# Patient Record
Sex: Female | Born: 1980 | Race: Black or African American | Hispanic: No | Marital: Single | Smoking: Never smoker
Health system: Southern US, Community
[De-identification: ages and names within clinical notes are randomized; demographics above are authoritative.]

## PROBLEM LIST (undated history)

## (undated) ENCOUNTER — Inpatient Hospital Stay (HOSPITAL_COMMUNITY): Payer: Self-pay

## (undated) DIAGNOSIS — E669 Obesity, unspecified: Secondary | ICD-10-CM

## (undated) DIAGNOSIS — Z98891 History of uterine scar from previous surgery: Secondary | ICD-10-CM

## (undated) DIAGNOSIS — K8020 Calculus of gallbladder without cholecystitis without obstruction: Secondary | ICD-10-CM

## (undated) DIAGNOSIS — Z3480 Encounter for supervision of other normal pregnancy, unspecified trimester: Secondary | ICD-10-CM

## (undated) DIAGNOSIS — K219 Gastro-esophageal reflux disease without esophagitis: Secondary | ICD-10-CM

---

## 2008-04-05 ENCOUNTER — Emergency Department (HOSPITAL_COMMUNITY): Admission: EM | Admit: 2008-04-05 | Discharge: 2008-04-05 | Payer: Self-pay | Admitting: Emergency Medicine

## 2008-08-18 ENCOUNTER — Emergency Department (HOSPITAL_COMMUNITY): Admission: EM | Admit: 2008-08-18 | Discharge: 2008-08-18 | Payer: Self-pay | Admitting: Emergency Medicine

## 2008-08-18 ENCOUNTER — Emergency Department (HOSPITAL_COMMUNITY): Admission: EM | Admit: 2008-08-18 | Discharge: 2008-08-19 | Payer: Self-pay | Admitting: Emergency Medicine

## 2008-08-20 ENCOUNTER — Emergency Department (HOSPITAL_COMMUNITY): Admission: EM | Admit: 2008-08-20 | Discharge: 2008-08-20 | Payer: Self-pay | Admitting: Emergency Medicine

## 2008-08-29 ENCOUNTER — Emergency Department (HOSPITAL_COMMUNITY): Admission: EM | Admit: 2008-08-29 | Discharge: 2008-08-29 | Payer: Self-pay | Admitting: Emergency Medicine

## 2010-04-04 ENCOUNTER — Inpatient Hospital Stay (HOSPITAL_COMMUNITY): Admission: AD | Admit: 2010-04-04 | Discharge: 2010-04-04 | Payer: Self-pay | Admitting: Obstetrics and Gynecology

## 2010-07-20 ENCOUNTER — Inpatient Hospital Stay (HOSPITAL_COMMUNITY): Admission: AD | Admit: 2010-07-20 | Discharge: 2010-07-20 | Payer: Self-pay | Admitting: Obstetrics and Gynecology

## 2011-01-27 LAB — WET PREP, GENITAL

## 2011-01-27 LAB — URINALYSIS, ROUTINE W REFLEX MICROSCOPIC
Glucose, UA: NEGATIVE mg/dL
Ketones, ur: >80 mg/dL — AB
Nitrite: NEGATIVE
Specific Gravity, Urine: >1.03 — ABNORMAL HIGH (ref 1.005–1.030)
pH: 6 (ref 5.0–8.0)

## 2011-01-27 LAB — URINE MICROSCOPIC-ADD ON

## 2011-01-27 LAB — STREP B DNA PROBE

## 2011-05-26 ENCOUNTER — Other Ambulatory Visit (HOSPITAL_COMMUNITY): Payer: Self-pay

## 2011-05-31 ENCOUNTER — Inpatient Hospital Stay (HOSPITAL_COMMUNITY): Admission: RE | Admit: 2011-05-31 | Payer: Self-pay | Source: Ambulatory Visit

## 2011-06-02 ENCOUNTER — Ambulatory Visit (HOSPITAL_COMMUNITY): Admission: RE | Admit: 2011-06-02 | Payer: Self-pay | Source: Ambulatory Visit | Admitting: Obstetrics and Gynecology

## 2011-06-02 ENCOUNTER — Encounter (HOSPITAL_COMMUNITY): Admission: RE | Payer: Self-pay | Source: Ambulatory Visit

## 2011-06-02 SURGERY — LIGATION, FALLOPIAN TUBE, LAPAROSCOPIC
Anesthesia: General | Laterality: Bilateral

## 2011-08-16 LAB — CBC
HCT: 37.4
HCT: 38.8
Hemoglobin: 12.5
MCHC: 33.2
MCHC: 33.3
MCV: 88.7
MCV: 89.4
Platelets: 214
Platelets: 231
RDW: 13.3
WBC: 6.9

## 2011-08-16 LAB — DIFFERENTIAL
Basophils Absolute: 0
Basophils Absolute: 0.1
Basophils Relative: 0
Eosinophils Absolute: 0
Eosinophils Absolute: 0.1
Eosinophils Relative: 1
Eosinophils Relative: 1
Lymphocytes Relative: 13
Lymphocytes Relative: 16
Lymphs Abs: 0.9
Lymphs Abs: 1.7
Monocytes Relative: 5
Monocytes Relative: 6
Neutro Abs: 8.1 — ABNORMAL HIGH
Neutrophils Relative %: 76

## 2011-08-16 LAB — URINALYSIS, ROUTINE W REFLEX MICROSCOPIC
Bilirubin Urine: NEGATIVE
Hgb urine dipstick: NEGATIVE
Ketones, ur: NEGATIVE
Nitrite: NEGATIVE
Protein, ur: NEGATIVE
Specific Gravity, Urine: 1.026
Urobilinogen, UA: 0.2
pH: 5.5

## 2011-08-16 LAB — COMPREHENSIVE METABOLIC PANEL
ALT: 21
AST: 22
Albumin: 3.5
Alkaline Phosphatase: 68
Alkaline Phosphatase: 69
CO2: 27
CO2: 28
Calcium: 8.6
Chloride: 102
GFR calc Af Amer: >60
Glucose, Bld: 99
Sodium: 134 — ABNORMAL LOW
Total Bilirubin: 0.5
Total Bilirubin: 0.5

## 2011-08-16 LAB — URINE CULTURE: Colony Count: NO GROWTH

## 2011-08-16 LAB — PREGNANCY, URINE: Preg Test, Ur: NEGATIVE

## 2011-08-16 LAB — URINE MICROSCOPIC-ADD ON

## 2011-08-16 LAB — LIPASE, BLOOD: Lipase: 18

## 2012-01-19 LAB — OB RESULTS CONSOLE RUBELLA ANTIBODY, IGM: Rubella: IMMUNE

## 2012-01-19 LAB — OB RESULTS CONSOLE ABO/RH: RH Type: POSITIVE

## 2012-01-19 LAB — OB RESULTS CONSOLE HEPATITIS B SURFACE ANTIGEN: Hepatitis B Surface Ag: NEGATIVE

## 2012-01-19 LAB — OB RESULTS CONSOLE ANTIBODY SCREEN: Antibody Screen: NEGATIVE

## 2012-01-19 LAB — OB RESULTS CONSOLE GC/CHLAMYDIA: Chlamydia: NEGATIVE

## 2012-02-01 ENCOUNTER — Other Ambulatory Visit (HOSPITAL_COMMUNITY): Payer: Self-pay | Admitting: Obstetrics and Gynecology

## 2012-02-01 ENCOUNTER — Other Ambulatory Visit: Payer: Self-pay

## 2012-02-01 DIAGNOSIS — Z0489 Encounter for examination and observation for other specified reasons: Secondary | ICD-10-CM

## 2012-02-22 ENCOUNTER — Encounter (HOSPITAL_COMMUNITY): Payer: Self-pay

## 2012-02-22 ENCOUNTER — Ambulatory Visit (HOSPITAL_COMMUNITY)
Admission: RE | Admit: 2012-02-22 | Discharge: 2012-02-22 | Disposition: A | Discharge status: Home / Self Care | Payer: Medicaid Other | Source: Ambulatory Visit | Attending: Obstetrics and Gynecology | Admitting: Obstetrics and Gynecology

## 2012-02-22 VITALS — BP 111/66 | HR 74 | Wt 259.0 lb

## 2012-02-22 DIAGNOSIS — E669 Obesity, unspecified: Secondary | ICD-10-CM | POA: Insufficient documentation

## 2012-02-22 DIAGNOSIS — O99210 Obesity complicating pregnancy, unspecified trimester: Secondary | ICD-10-CM | POA: Insufficient documentation

## 2012-02-22 DIAGNOSIS — R638 Other symptoms and signs concerning food and fluid intake: Secondary | ICD-10-CM

## 2012-02-22 DIAGNOSIS — O358XX0 Maternal care for other (suspected) fetal abnormality and damage, not applicable or unspecified: Secondary | ICD-10-CM | POA: Insufficient documentation

## 2012-02-22 DIAGNOSIS — Z0489 Encounter for examination and observation for other specified reasons: Secondary | ICD-10-CM

## 2012-02-22 DIAGNOSIS — Z1389 Encounter for screening for other disorder: Secondary | ICD-10-CM | POA: Insufficient documentation

## 2012-02-22 DIAGNOSIS — Z363 Encounter for antenatal screening for malformations: Secondary | ICD-10-CM | POA: Insufficient documentation

## 2012-02-22 DIAGNOSIS — O34219 Maternal care for unspecified type scar from previous cesarean delivery: Secondary | ICD-10-CM | POA: Insufficient documentation

## 2012-02-22 HISTORY — DX: Obesity, unspecified: E66.9

## 2012-02-22 NOTE — Progress Notes (Signed)
Obstetric ultrasound performed today.   Appropriate fetal growth based on LMP Normal amniotic fluid volume Normal fetal anatomic survey (some limited heart, spine, and abdominal wall views) No markers of fetal aneuploidy identified  Repeat ultrasound scheduled in 4-6 weeks to re evaluate fetal growth and anatomy.   Please see full report in [facility]

## 2012-03-11 ENCOUNTER — Inpatient Hospital Stay (HOSPITAL_COMMUNITY)
Admission: AD | Admit: 2012-03-11 | Discharge: 2012-03-11 | Disposition: A | Discharge status: Home / Self Care | Payer: Medicaid Other | Source: Ambulatory Visit | Attending: Obstetrics and Gynecology | Admitting: Obstetrics and Gynecology

## 2012-03-11 ENCOUNTER — Encounter (HOSPITAL_COMMUNITY): Payer: Self-pay | Admitting: *Deleted

## 2012-03-11 DIAGNOSIS — O368190 Decreased fetal movements, unspecified trimester, not applicable or unspecified: Secondary | ICD-10-CM | POA: Insufficient documentation

## 2012-03-11 NOTE — MAU Note (Signed)
Pt admitted via [facility]. EMS to Rm #5. Has not felt baby move since 2400. Alert and oriented. Ambulatoy in room when off stretcher. Oriented to room.

## 2012-03-11 NOTE — MAU Note (Signed)
"  The baby is usually very active. I have not felt him move since 2400"

## 2012-03-11 NOTE — Discharge Instructions (Signed)
Fetal Movement Counts Patient Name: __________________________________________________ Patient Due Date: ____________________ Kick counts is highly recommended in high risk pregnancies, but it is a good idea for every pregnant woman to do. Start counting fetal movements at 28 weeks of the pregnancy. Fetal movements increase after eating a full meal or eating or drinking something sweet (the blood sugar is higher). It is also important to drink plenty of fluids (well hydrated) before doing the count. Lie on your left side because it helps with the circulation or you can sit in a comfortable chair with your arms over your belly (abdomen) with no distractions around you. DOING THE COUNT  Try to do the count the same time of day each time you do it.   Mark the day and time, then see how long it takes for you to feel 10 movements (kicks, flutters, swishes, rolls). You should have at least 10 movements within 2 hours. You will most likely feel 10 movements in much less than 2 hours. If you do not, wait an hour and count again. After a couple of days you will see a pattern.   What you are looking for is a change in the pattern or not enough counts in 2 hours. Is it taking longer in time to reach 10 movements?  SEEK MEDICAL CARE IF:  You feel less than 10 counts in 2 hours. Tried twice.   No movement in one hour.   The pattern is changing or taking longer each day to reach 10 counts in 2 hours.   You feel the baby is not moving as it usually does.  Date: ____________ Movements: ____________ Start time: ____________ Finish time: ____________  Date: ____________ Movements: ____________ Start time: ____________ Finish time: ____________ Date: ____________ Movements: ____________ Start time: ____________ Finish time: ____________ Date: ____________ Movements: ____________ Start time: ____________ Finish time: ____________ Date: ____________ Movements: ____________ Start time: ____________ Finish time:  ____________ Date: ____________ Movements: ____________ Start time: ____________ Finish time: ____________ Date: ____________ Movements: ____________ Start time: ____________ Finish time: ____________ Date: ____________ Movements: ____________ Start time: ____________ Finish time: ____________  Date: ____________ Movements: ____________ Start time: ____________ Finish time: ____________ Date: ____________ Movements: ____________ Start time: ____________ Finish time: ____________ Date: ____________ Movements: ____________ Start time: ____________ Finish time: ____________ Date: ____________ Movements: ____________ Start time: ____________ Finish time: ____________ Date: ____________ Movements: ____________ Start time: ____________ Finish time: ____________ Date: ____________ Movements: ____________ Start time: ____________ Finish time: ____________ Date: ____________ Movements: ____________ Start time: ____________ Finish time: ____________  Date: ____________ Movements: ____________ Start time: ____________ Finish time: ____________ Date: ____________ Movements: ____________ Start time: ____________ Finish time: ____________ Date: ____________ Movements: ____________ Start time: ____________ Finish time: ____________ Date: ____________ Movements: ____________ Start time: ____________ Finish time: ____________ Date: ____________ Movements: ____________ Start time: ____________ Finish time: ____________ Date: ____________ Movements: ____________ Start time: ____________ Finish time: ____________ Date: ____________ Movements: ____________ Start time: ____________ Finish time: ____________  Date: ____________ Movements: ____________ Start time: ____________ Finish time: ____________ Date: ____________ Movements: ____________ Start time: ____________ Finish time: ____________ Date: ____________ Movements: ____________ Start time: ____________ Finish time: ____________ Date: ____________ Movements:  ____________ Start time: ____________ Finish time: ____________ Date: ____________ Movements: ____________ Start time: ____________ Finish time: ____________ Date: ____________ Movements: ____________ Start time: ____________ Finish time: ____________ Date: ____________ Movements: ____________ Start time: ____________ Finish time: ____________  Date: ____________ Movements: ____________ Start time: ____________ Finish time: ____________ Date: ____________ Movements: ____________ Start time: ____________ Finish time: ____________ Date: ____________ Movements: ____________ Start time:   ____________ Finish time: ____________ Date: ____________ Movements: ____________ Start time: ____________ Finish time: ____________ Date: ____________ Movements: ____________ Start time: ____________ Finish time: ____________ Date: ____________ Movements: ____________ Start time: ____________ Finish time: ____________ Date: ____________ Movements: ____________ Start time: ____________ Finish time: ____________  Date: ____________ Movements: ____________ Start time: ____________ Finish time: ____________ Date: ____________ Movements: ____________ Start time: ____________ Finish time: ____________ Date: ____________ Movements: ____________ Start time: ____________ Finish time: ____________ Date: ____________ Movements: ____________ Start time: ____________ Finish time: ____________ Date: ____________ Movements: ____________ Start time: ____________ Finish time: ____________ Date: ____________ Movements: ____________ Start time: ____________ Finish time: ____________ Date: ____________ Movements: ____________ Start time: ____________ Finish time: ____________  Date: ____________ Movements: ____________ Start time: ____________ Finish time: ____________ Date: ____________ Movements: ____________ Start time: ____________ Finish time: ____________ Date: ____________ Movements: ____________ Start time: ____________ Finish  time: ____________ Date: ____________ Movements: ____________ Start time: ____________ Finish time: ____________ Date: ____________ Movements: ____________ Start time: ____________ Finish time: ____________ Date: ____________ Movements: ____________ Start time: ____________ Finish time: ____________ Date: ____________ Movements: ____________ Start time: ____________ Finish time: ____________  Date: ____________ Movements: ____________ Start time: ____________ Finish time: ____________ Date: ____________ Movements: ____________ Start time: ____________ Finish time: ____________ Date: ____________ Movements: ____________ Start time: ____________ Finish time: ____________ Date: ____________ Movements: ____________ Start time: ____________ Finish time: ____________ Date: ____________ Movements: ____________ Start time: ____________ Finish time: ____________ Date: ____________ Movements: ____________ Start time: ____________ Finish time: ____________ Document Released: 11/30/2006 Document Revised: 10/20/2011 Document Reviewed: 06/02/2009 [employer] Patient Information 2012 [employer]. 

## 2012-03-11 NOTE — Progress Notes (Signed)
Ivonne Andrew CNM in. EFM strip reviewed. D/C plan discussed.

## 2012-03-11 NOTE — Progress Notes (Signed)
Ivonne Andrew CNM notified of pt's admission and status. Will see pt. Baby active and pt feeling FM

## 2012-03-11 NOTE — MAU Provider Note (Signed)
Chief Complaint:  Decreased Fetal Movement   First Provider Initiated Contact with Patient 03/11/12 0519      HPI  [name] is  31 y.o. G4P3003 at [redacted]w[redacted]d presents with decreased Fm. Denies contractions, leakage of fluid or vaginal bleeding.    Past Medical History: Past Medical History  Diagnosis Date  . Obesity   . Obesity     Past Surgical History: Past Surgical History  Procedure Date  . Cesarean section     Family History: Family History  Problem Relation Age of Onset  . Anesthesia problems Neg Hx   . Hypotension Neg Hx   . Malignant hyperthermia Neg Hx   . Pseudochol deficiency Neg Hx     Social History: History  Substance Use Topics  . Smoking status: Never Smoker   . Smokeless tobacco: Not on file  . Alcohol Use: No    Allergies:  Allergies  Allergen Reactions  . Amoxicillin Hives  . Penicillins Hives    Meds:  Prescriptions prior to admission  Medication Sig Dispense Refill  . Prenatal Vit-Fe Fumarate-FA (PRENATAL MULTIVITAMIN) TABS Take 1 tablet by mouth daily.          Physical Exam  Blood pressure 120/60, pulse 105, temperature 97.1 F (36.2 C), temperature source Oral, resp. rate 20, height 5\' 3"  (1.6 m), weight 110.224 kg (243 lb), last menstrual period 08/29/2011. GENERAL: Well-developed, well-nourished female in no acute distress.  HEENT: normocephalic, good dentition HEART: normal rate RESP: normal effort ABDOMEN: Soft, nontender, nondistended, gravid.  EXTREMITIES: Nontender, no edema NEURO: alert and oriented  SPECULUM EXAM: Deferred    FHT:  Baseline 140 , moderate variability, accelerations present, no decelerations Contractions: none   Pt feeling baby move while in MAU  Assessment: 1. Decreased fetal movement in pregnancy, antepartum, resolved    Plan: D/C home per consult w/ [name] Follow-up Information    Follow up with Angela Plume, MD. (as scheduled or  as needed if symptoms worsen)    Contact  information:   [employer] [address] [place], [address] [place] [address] [phone]         Medication List  As of 03/11/2012  5:37 AM   CONTINUE taking these medications         prenatal multivitamin Tabs          PTL precautions and FKCs  [name] 4/28/20135:32 AM

## 2012-03-11 NOTE — Progress Notes (Signed)
Written and verbal d/c instructions given and understanding voiced. Fetal kick count reviewed.

## 2012-03-28 ENCOUNTER — Ambulatory Visit (HOSPITAL_COMMUNITY)
Admission: RE | Admit: 2012-03-28 | Discharge: 2012-03-28 | Disposition: A | Discharge status: Home / Self Care | Payer: Medicaid Other | Source: Ambulatory Visit | Attending: Obstetrics and Gynecology | Admitting: Obstetrics and Gynecology

## 2012-03-28 VITALS — BP 97/52 | HR 83 | Wt 255.0 lb

## 2012-03-28 DIAGNOSIS — O99210 Obesity complicating pregnancy, unspecified trimester: Secondary | ICD-10-CM | POA: Insufficient documentation

## 2012-03-28 DIAGNOSIS — Z1389 Encounter for screening for other disorder: Secondary | ICD-10-CM | POA: Insufficient documentation

## 2012-03-28 DIAGNOSIS — R638 Other symptoms and signs concerning food and fluid intake: Secondary | ICD-10-CM

## 2012-03-28 DIAGNOSIS — O358XX0 Maternal care for other (suspected) fetal abnormality and damage, not applicable or unspecified: Secondary | ICD-10-CM | POA: Insufficient documentation

## 2012-03-28 DIAGNOSIS — O34219 Maternal care for unspecified type scar from previous cesarean delivery: Secondary | ICD-10-CM | POA: Insufficient documentation

## 2012-03-28 DIAGNOSIS — Z363 Encounter for antenatal screening for malformations: Secondary | ICD-10-CM | POA: Insufficient documentation

## 2012-03-28 DIAGNOSIS — E669 Obesity, unspecified: Secondary | ICD-10-CM | POA: Insufficient documentation

## 2012-05-06 ENCOUNTER — Inpatient Hospital Stay (HOSPITAL_COMMUNITY)
Admission: AD | Admit: 2012-05-06 | Discharge: 2012-05-08 | DRG: 781 | Disposition: A | Discharge status: Home / Self Care | Payer: Medicaid Other | Source: Ambulatory Visit | Attending: Obstetrics and Gynecology | Admitting: Obstetrics and Gynecology

## 2012-05-06 ENCOUNTER — Encounter (HOSPITAL_COMMUNITY): Payer: Self-pay

## 2012-05-06 ENCOUNTER — Inpatient Hospital Stay (HOSPITAL_COMMUNITY): Payer: Medicaid Other

## 2012-05-06 DIAGNOSIS — K819 Cholecystitis, unspecified: Secondary | ICD-10-CM

## 2012-05-06 DIAGNOSIS — R1011 Right upper quadrant pain: Secondary | ICD-10-CM | POA: Diagnosis present

## 2012-05-06 DIAGNOSIS — K8010 Calculus of gallbladder with chronic cholecystitis without obstruction: Secondary | ICD-10-CM | POA: Diagnosis present

## 2012-05-06 DIAGNOSIS — O9989 Other specified diseases and conditions complicating pregnancy, childbirth and the puerperium: Principal | ICD-10-CM | POA: Diagnosis present

## 2012-05-06 DIAGNOSIS — O26899 Other specified pregnancy related conditions, unspecified trimester: Secondary | ICD-10-CM

## 2012-05-06 DIAGNOSIS — M549 Dorsalgia, unspecified: Secondary | ICD-10-CM | POA: Diagnosis present

## 2012-05-06 DIAGNOSIS — K8020 Calculus of gallbladder without cholecystitis without obstruction: Secondary | ICD-10-CM

## 2012-05-06 HISTORY — DX: Gastro-esophageal reflux disease without esophagitis: K21.9

## 2012-05-06 LAB — COMPREHENSIVE METABOLIC PANEL
ALT: 5 U/L (ref 0–35)
Alkaline Phosphatase: 93 U/L (ref 39–117)
BUN: 7 mg/dL (ref 6–23)
CO2: 19 mEq/L (ref 19–32)
Chloride: 107 mEq/L (ref 96–112)
GFR calc Af Amer: >90 mL/min (ref >90)
Glucose, Bld: 86 mg/dL (ref 70–99)
Potassium: 3.7 mEq/L (ref 3.5–5.1)
Sodium: 138 mEq/L (ref 135–145)
Total Bilirubin: 0.3 mg/dL (ref 0.3–1.2)
Total Protein: 6.1 g/dL (ref 6.0–8.3)

## 2012-05-06 LAB — CBC
Hemoglobin: 10.1 g/dL — ABNORMAL LOW (ref 12.0–15.0)
Platelets: 139 10*3/uL — ABNORMAL LOW (ref 150–400)
RBC: 3.3 MIL/uL — ABNORMAL LOW (ref 3.87–5.11)
WBC: 8.3 10*3/uL (ref 4.0–10.5)

## 2012-05-06 LAB — DIFFERENTIAL
Eosinophils Absolute: 0 10*3/uL (ref 0.0–0.7)
Lymphocytes Relative: 23 % (ref 12–46)
Lymphs Abs: 1.9 10*3/uL (ref 0.7–4.0)
Monocytes Relative: 7 % (ref 3–12)
Neutro Abs: 5.8 10*3/uL (ref 1.7–7.7)
Neutrophils Relative %: 70 % (ref 43–77)

## 2012-05-06 LAB — URINE MICROSCOPIC-ADD ON

## 2012-05-06 LAB — URINALYSIS, ROUTINE W REFLEX MICROSCOPIC
Bilirubin Urine: NEGATIVE
Ketones, ur: 15 mg/dL — AB
Specific Gravity, Urine: 1.01 (ref 1.005–1.030)
pH: 6 (ref 5.0–8.0)

## 2012-05-06 MED ORDER — PROMETHAZINE HCL 25 MG PO TABS: 12.5000 mg | ORAL_TABLET | Freq: Four times a day (QID) | ORAL | Status: DC | PRN

## 2012-05-06 MED ORDER — PRENATAL MULTIVITAMIN CH
1.0000 | ORAL_TABLET | Freq: Every day | ORAL | Status: DC
  Administered 2012-05-06 – 2012-05-07 (×2): 1 via ORAL
  Filled 2012-05-06 (×2): qty 1

## 2012-05-06 MED ORDER — ALUM & MAG HYDROXIDE-SIMETH 200-200-20 MG/5ML PO SUSP
30.0000 mL | ORAL | Status: DC | PRN
  Administered 2012-05-06 – 2012-05-07 (×2): 30 mL via ORAL
  Filled 2012-05-06 (×2): qty 30

## 2012-05-06 MED ORDER — ZOLPIDEM TARTRATE 5 MG PO TABS: 5.0000 mg | ORAL_TABLET | Freq: Every evening | ORAL | Status: DC | PRN

## 2012-05-06 MED ORDER — SIMETHICONE 80 MG PO CHEW: 80.0000 mg | CHEWABLE_TABLET | Freq: Four times a day (QID) | ORAL | Status: DC | PRN

## 2012-05-06 MED ORDER — ONDANSETRON HCL 4 MG PO TABS: 4.0000 mg | ORAL_TABLET | Freq: Four times a day (QID) | ORAL | Status: DC | PRN

## 2012-05-06 MED ORDER — ONDANSETRON HCL 4 MG/2ML IJ SOLN
4.0000 mg | Freq: Four times a day (QID) | INTRAMUSCULAR | Status: DC | PRN
  Administered 2012-05-06 – 2012-05-07 (×2): 4 mg via INTRAVENOUS
  Filled 2012-05-06 (×3): qty 2

## 2012-05-06 MED ORDER — LACTATED RINGERS IV SOLN
INTRAVENOUS | Status: DC
  Administered 2012-05-06 (×3): via INTRAVENOUS

## 2012-05-06 MED ORDER — ONDANSETRON HCL 4 MG/2ML IJ SOLN
4.0000 mg | Freq: Once | INTRAMUSCULAR | Status: AC
  Administered 2012-05-06: 4 mg via INTRAVENOUS
  Filled 2012-05-06: qty 2

## 2012-05-06 MED ORDER — GI COCKTAIL ~~LOC~~
30.0000 mL | Freq: Once | ORAL | Status: AC
  Administered 2012-05-06: 30 mL via ORAL
  Filled 2012-05-06: qty 30

## 2012-05-06 MED ORDER — HYDROMORPHONE HCL PF 1 MG/ML IJ SOLN
1.0000 mg | Freq: Once | INTRAMUSCULAR | Status: AC
  Administered 2012-05-06: 1 mg via INTRAVENOUS
  Filled 2012-05-06: qty 1

## 2012-05-06 MED ORDER — DOCUSATE SODIUM 100 MG PO CAPS
100.0000 mg | ORAL_CAPSULE | Freq: Two times a day (BID) | ORAL | Status: DC
  Administered 2012-05-06 – 2012-05-07 (×4): 100 mg via ORAL
  Filled 2012-05-06 (×4): qty 1

## 2012-05-06 MED ORDER — HYDROMORPHONE HCL PF 1 MG/ML IJ SOLN
1.0000 mg | INTRAMUSCULAR | Status: DC | PRN
  Administered 2012-05-06 – 2012-05-08 (×9): 1 mg via INTRAVENOUS
  Filled 2012-05-06 (×9): qty 1

## 2012-05-06 MED ORDER — LACTATED RINGERS IV SOLN
INTRAVENOUS | Status: DC
  Administered 2012-05-07 – 2012-05-08 (×4): via INTRAVENOUS

## 2012-05-06 MED ORDER — OXYCODONE-ACETAMINOPHEN 7.5-325 MG PO TABS: 1.0000 | ORAL_TABLET | ORAL | Status: DC | PRN

## 2012-05-06 NOTE — MAU Provider Note (Signed)
History     CSN: [id number]  Arrival date & time 05/06/12  7829   None     Chief Complaint  Patient presents with  . Back Pain    HPI [name] is a 31 y.o. female @ [redacted]w[redacted]d gestation who presents to MAU for abdominal and back pain. The pain started approximately 2 am. She describes the pain as sharp that is constant and woke her. She rates the pain as 8/10.the pain is located in the right upper quadrant of the abdomen. The pain radiates to her back.  Denies nausea, vomiting, leaking of fluid or vaginal bleeding. Scheduled for C-section 05/30/12 with Dr. Ellyn Hack. The history was provided by the patient and her prenatal record.  Past Medical History  Diagnosis Date  . Obesity   . Obesity     Past Surgical History  Procedure Date  . Cesarean section     Family History  Problem Relation Age of Onset  . Anesthesia problems Neg Hx   . Hypotension Neg Hx   . Malignant hyperthermia Neg Hx   . Pseudochol deficiency Neg Hx     History  Substance Use Topics  . Smoking status: Never Smoker   . Smokeless tobacco: Not on file  . Alcohol Use: No    OB History    Grav Para Term Preterm Abortions TAB SAB Ect Mult Living   4 3 3  0 0 0 0 0 0 3      Review of Systems  Constitutional: Negative for fever, chills, diaphoresis and fatigue.  HENT: Negative for ear pain, congestion, sore throat, facial swelling, neck pain, neck stiffness, dental problem and sinus pressure.   Eyes: Negative for photophobia, pain, discharge and visual disturbance.  Respiratory: Negative for cough, chest tightness and wheezing.   Cardiovascular: Positive for leg swelling. Negative for palpitations.  Gastrointestinal: Positive for abdominal pain. Negative for nausea, vomiting, diarrhea, constipation and abdominal distention.  Genitourinary: Negative for dysuria, urgency, frequency, flank pain, vaginal bleeding, vaginal discharge and difficulty urinating.  Musculoskeletal: Positive for back pain.  Negative for myalgias and gait problem.  Skin: Negative for color change and rash.  Neurological: Negative for dizziness, speech difficulty, weakness, light-headedness, numbness and headaches.  Psychiatric/Behavioral: Negative for confusion and agitation. The patient is not nervous/anxious.     Allergies  Amoxicillin and Penicillins  Home Medications  No current outpatient prescriptions on file.  BP 98/49  Pulse 57  Temp 96.6 F (35.9 C) (Axillary)  Resp 18  LMP 08/29/2011  Physical Exam  Nursing note and vitals reviewed. Constitutional: She is oriented to person, place, and time. She appears well-developed and well-nourished. No distress.  HENT:  Head: Normocephalic.  Eyes: EOM are normal.  Neck: Neck supple.  Cardiovascular: Normal rate.   Pulmonary/Chest: Effort normal.  Abdominal: Soft. There is tenderness in the right upper quadrant. There is guarding and positive Murphy's sign. There is no rigidity.  Genitourinary:       Cervix closed, 50%  Musculoskeletal: Normal range of motion.  Neurological: She is alert and oriented to person, place, and time. No cranial nerve deficit.  Skin: Skin is warm and dry.  Psychiatric: She has a normal mood and affect. Her behavior is normal. Judgment and thought content normal.   Results for orders placed during the hospital encounter of 05/06/12 (from the past 24 hour(s))  URINALYSIS, ROUTINE W REFLEX MICROSCOPIC     Status: Abnormal   Collection Time   05/06/12  3:20 AM  Component Value Range   Color, Urine YELLOW  YELLOW   APPearance CLEAR  CLEAR   Specific Gravity, Urine 1.010  1.005 - 1.030   pH 6.0  5.0 - 8.0   Glucose, UA NEGATIVE  NEGATIVE mg/dL   Hgb urine dipstick TRACE (*) NEGATIVE   Bilirubin Urine NEGATIVE  NEGATIVE   Ketones, ur 15 (*) NEGATIVE mg/dL   Protein, ur NEGATIVE  NEGATIVE mg/dL   Urobilinogen, UA 0.2  0.0 - 1.0 mg/dL   Nitrite NEGATIVE  NEGATIVE   Leukocytes, UA LARGE (*) NEGATIVE  URINE  MICROSCOPIC-ADD ON     Status: Abnormal   Collection Time   05/06/12  3:20 AM      Component Value Range   Squamous Epithelial / LPF MANY (*) RARE   WBC, UA TOO NUMEROUS TO COUNT  <3 WBC/hpf   RBC / HPF 0-2  <3 RBC/hpf   Urine-Other MUCOUS PRESENT    CBC     Status: Abnormal   Collection Time   05/06/12  4:42 AM      Component Value Range   WBC 8.3  4.0 - 10.5 K/uL   RBC 3.30 (*) 3.87 - 5.11 MIL/uL   Hemoglobin 10.1 (*) 12.0 - 15.0 g/dL   HCT 16.1 (*) 09.6 - 04.5 %   MCV 89.7  78.0 - 100.0 fL   MCH 30.6  26.0 - 34.0 pg   MCHC 34.1  30.0 - 36.0 g/dL   RDW 40.9  81.1 - 91.4 %   Platelets 139 (*) 150 - 400 K/uL  DIFFERENTIAL     Status: Normal   Collection Time   05/06/12  4:42 AM      Component Value Range   Neutrophils Relative 70  43 - 77 %   Neutro Abs 5.8  1.7 - 7.7 K/uL   Lymphocytes Relative 23  12 - 46 %   Lymphs Abs 1.9  0.7 - 4.0 K/uL   Monocytes Relative 7  3 - 12 %   Monocytes Absolute 0.6  0.1 - 1.0 K/uL   Eosinophils Relative 1  0 - 5 %   Eosinophils Absolute 0.0  0.0 - 0.7 K/uL   Basophils Relative 0  0 - 1 %   Basophils Absolute 0.0  0.0 - 0.1 K/uL  COMPREHENSIVE METABOLIC PANEL     Status: Abnormal   Collection Time   05/06/12  4:42 AM      Component Value Range   Sodium 138  135 - 145 mEq/L   Potassium 3.7  3.5 - 5.1 mEq/L   Chloride 107  96 - 112 mEq/L   CO2 19  19 - 32 mEq/L   Glucose, Bld 86  70 - 99 mg/dL   BUN 7  6 - 23 mg/dL   Creatinine, Ser 7.82  0.50 - 1.10 mg/dL   Calcium 8.9  8.4 - 95.6 mg/dL   Total Protein 6.1  6.0 - 8.3 g/dL   Albumin 2.7 (*) 3.5 - 5.2 g/dL   AST 15  0 - 37 U/L   ALT 5  0 - 35 U/L   Alkaline Phosphatase 93  39 - 117 U/L   Total Bilirubin 0.3  0.3 - 1.2 mg/dL   GFR calc non Af Amer >90  >90 mL/min   GFR calc Af Amer >90  >90 mL/min  LIPASE, BLOOD     Status: Normal   Collection Time   05/06/12  4:42 AM      Component Value  Range   Lipase 24  11 - 59 U/L  AMYLASE     Status: Normal   Collection Time   05/06/12  4:42  AM      Component Value Range   Amylase 22  0 - 105 U/L   ED Course: Discussed with Dr. Ambrose Mantle will draw labs and order ultrasound for possible cholelithiasis   Procedures EFM: base line 135, no decelerations, no contractions, reactive tracing MDM  US Abdomen Limited Ruq  05/06/2012  *RADIOLOGY REPORT*  Clinical Data:  Right upper quadrant abdominal pain, radiating to the upper back.  ABDOMINAL ULTRASOUND LIMITED (RIGHT UPPER QUADRANT)  Comparison:  None  Findings:  Gallbladder:  The gallbladder is mildly distended; a large 2.2 cm stone is noted lodged at the neck of the gallbladder.  An associated positive ultrasonographic Murphy's sign is seen; this may reflect obstruction due to the stone.  No pericholecystic fluid or gallbladder wall thickening is seen to suggest cholecystitis.  Common Bile Duct:  0.4 cm in diameter; within normal limits in caliber.  Liver:  Normal parenchymal echogenicity and echotexture; no focal lesions identified.  Limited Doppler evaluation demonstrates normal blood flow within the liver.  IMPRESSION: Gallbladder distended, with a large 2.2 cm stone lodged at the neck of the gallbladder.  Associated positive ultrasonographic Murphy's sign noted; this may reflect obstruction due to the stone.  No evidence for cholecystitis; no evidence for more distal obstruction.  Original Report Authenticated By: Tonia Ghent, M.D.    Assessment: [redacted]w[redacted]d gestation with abdominal pain     Cholecystitis  Plan:  Dr. Ambrose Mantle notified of lab and ultrasound results, he will see the patient in MAU   Rx Percocet   Dr. Ambrose Mantle will discuss the case with general surgeon       Note: Dr. Ambrose Mantle in to evaluate the patient and he will admit for pain control and nausea control. He will request consult with general       surgery. Cancel discharge. Cancel Rx Admission orders written

## 2012-05-06 NOTE — Discharge Instructions (Signed)
Cholelithiasis Cholelithiasis (also called gallstones) is a form of gallbladder disease where gallstones form in your gallbladder. The gallbladder is a non-essential organ that stores bile made in the liver, which helps digest fats. Gallstones begin as small crystals and slowly grow into stones. Gallstone pain occurs when the gallbladder spasms, and a gallstone is blocking the duct. Pain can also occur when a stone passes out of the duct.  Women are more likely to develop gallstones than men. Other factors that increase the risk of gallbladder disease are:  Having multiple pregnancies. Physicians sometimes advise removing diseased gallbladders before future pregnancies.   Obesity.   Diets heavy in fried foods and fat.   Increasing age (older than 62).   Prolonged use of medications containing female hormones.   Diabetes mellitus.   Rapid weight loss.   Family history of gallstones (heredity).  SYMPTOMS  Feeling sick to your stomach (nauseous).   Abdominal pain.   Yellowing of the skin (jaundice).   Sudden pain. It may persist from several minutes to several hours.   Worsening pain with deep breathing or when jarred.   Fever.   Tenderness to the touch.  In some cases, when gallstones do not move into the bile duct, people have no pain or symptoms. These are called "silent" gallstones. TREATMENT In severe cases, emergency surgery may be required. HOME CARE INSTRUCTIONS   Only take over-the-counter or prescription medicines for pain, discomfort, or fever as directed by your caregiver.   Follow a low-fat diet until seen again. Fat causes the gallbladder to contract, which can result in pain.   Follow up as instructed. Attacks are almost always recurrent and surgery is usually required for permanent treatment.  SEEK IMMEDIATE MEDICAL CARE IF:   Your pain increases and is not controlled by medications.   You have an oral temperature above 102 F (38.9 C), not controlled by  medication.   You develop nausea and vomiting.  MAKE SURE YOU:   Understand these instructions.   Will watch your condition.   Will get help right away if you are not doing well or get worse.  Document Released: 10/27/2005 Document Revised: 10/20/2011 Document Reviewed: 12/30/2010 [facility] Patient Information 2012 [place], [place].Cholelithiasis Cholelithiasis (also called gallstones) is a form of gallbladder disease where gallstones form in your gallbladder. The gallbladder is a non-essential organ that stores bile made in the liver, which helps digest fats. Gallstones begin as small crystals and slowly grow into stones. Gallstone pain occurs when the gallbladder spasms, and a gallstone is blocking the duct. Pain can also occur when a stone passes out of the duct.  Women are more likely to develop gallstones than men. Other factors that increase the risk of gallbladder disease are:  Having multiple pregnancies. Physicians sometimes advise removing diseased gallbladders before future pregnancies.   Obesity.   Diets heavy in fried foods and fat.   Increasing age (older than 46).   Prolonged use of medications containing female hormones.   Diabetes mellitus.   Rapid weight loss.   Family history of gallstones (heredity).  SYMPTOMS  Feeling sick to your stomach (nauseous).   Abdominal pain.   Yellowing of the skin (jaundice).   Sudden pain. It may persist from several minutes to several hours.   Worsening pain with deep breathing or when jarred.   Fever.   Tenderness to the touch.  In some cases, when gallstones do not move into the bile duct, people have no pain or symptoms. These are called "  silent" gallstones. TREATMENT In severe cases, emergency surgery may be required. HOME CARE INSTRUCTIONS   Only take over-the-counter or prescription medicines for pain, discomfort, or fever as directed by your caregiver.   Follow a low-fat diet until seen again. Fat causes the  gallbladder to contract, which can result in pain.   Follow up as instructed. Attacks are almost always recurrent and surgery is usually required for permanent treatment.  SEEK IMMEDIATE MEDICAL CARE IF:   Your pain increases and is not controlled by medications.   You have an oral temperature above 102 F (38.9 C), not controlled by medication.   You develop nausea and vomiting.  MAKE SURE YOU:   Understand these instructions.   Will watch your condition.   Will get help right away if you are not doing well or get worse.  Document Released: 10/27/2005 Document Revised: 10/20/2011 Document Reviewed: 12/30/2010 [facility] Patient Information 2012 [place], [place].

## 2012-05-06 NOTE — Consult Note (Signed)
Re:   [name] DOB:   04-21-1981 MRN:   [id number]  ASSESSMENT AND PLAN: 1.  Symptomatic cholelithiasis.  Apparent impacted gall stone in the neck of the GB with normal labs.  I discussed with the patient the indications and risks of gall bladder surgery.  The primary risks of gall bladder surgery include, but are not limited to, bleeding, infection, common bile duct injury, and open surgery.    The primary issue is the patient's pregnancy.  Dr. Ambrose Mantle and I have discussed several options:  A.) Medical management with pain meds, IVF, and supportive care. B.) Cholecystectomy with monitoring the baby, C.) Perc drain of GB, and D.) Deliver first, then do cholecystectomy later.  Each option has its benefits and risks and I explained this to the patient.  Hopefully she will get better with medical management.  Will follow.  2.  Pregnant at approx 35 weeks, 6 days.  She has 3 children ages 66, 32, and 1.  All children were delivered by C section.  She is scheduled for C section 05/30/2012. 3.  Morbid obesity   She has been counseled about weight loss.  Her weight does complicate the surgery and she is aware of that.  Chief Complaint  Patient presents with  . Back Pain   REFERRING PHYSICIAN:   Dr. Ronnell Freshwater  HISTORY OF PRESENT ILLNESS: [name] is a 31 y.o. (DOB: 17-Jan-1981)  AA female whose primary care physician is Dr. Mayford Knife, [address] and is admitted to [facility] hospital for abdominal pain and symptomatic cholelithiasis.  She has had no prior GI history.  She developed acute abdominal pain around 2 AM this morning.  She has no history of stomach disease.  No history of liver disease.  No history of gall bladder disease.  No history of pancreas disease.  No history of colon disease.  Her only prior abdominal surgery has been 3 C sections for her children.  Labs:  WBC - 8,300, Hgb - 10.1, T. Bili - 0.3, Lipase - 24, Alk Phos - 93. [place] - 2.2 cm stone impacted in  neck of GB.   Past Medical History  Diagnosis Date  . Obesity   . Obesity   . GERD (gastroesophageal reflux disease)      Current Facility-Administered Medications  Medication Dose Route Frequency Provider Last Rate Last Dose  . alum & mag hydroxide-simeth (MAALOX/MYLANTA) 200-200-20 MG/5ML suspension 30 mL  30 mL Oral Q4H PRN Janne Napoleon, NP      . docusate sodium (COLACE) capsule 100 mg  100 mg Oral BID Janne Napoleon, NP   100 mg at 05/06/12 1113  . gi cocktail (Maalox,Lidocaine,Donnatal)  30 mL Oral Once Bing Plume, MD   30 mL at 05/06/12 0708  . HYDROmorphone (DILAUDID) injection 1 mg  1 mg Intravenous Once [facility], NP   1 mg at 05/06/12 0448  . HYDROmorphone (DILAUDID) injection 1 mg  1 mg Intravenous Once Bing Plume, MD   1 mg at 05/06/12 4098  . HYDROmorphone (DILAUDID) injection 1 mg  1 mg Intravenous Q4H PRN Janne Napoleon, NP      . lactated ringers infusion   Intravenous Continuous Janne Napoleon, NP 125 mL/hr at 05/06/12 [phone]    . lactated ringers infusion   Intravenous Continuous [facility] Orlene Och, NP      . ondansetron [facility]) injection 4 mg  4 mg Intravenous Once [facility] Orlene Och, NP  4 mg at 05/06/12 0448  . ondansetron (ZOFRAN) tablet 4 mg  4 mg Oral Q6H PRN Janne Napoleon, NP       Or  . ondansetron [facility]) injection 4 mg  4 mg Intravenous Q6H PRN Janne Napoleon, NP      . prenatal multivitamin tablet 1 tablet  1 tablet Oral Daily Janne Napoleon, NP   1 tablet at 05/06/12 1112  . simethicone (MYLICON) chewable tablet 80 mg  80 mg Oral QID PRN Janne Napoleon, NP      . zolpidem (AMBIEN) tablet 5-10 mg  5-10 mg Oral QHS PRN Janne Napoleon, NP          Allergies  Allergen Reactions  . Amoxicillin Hives  . Penicillins Hives    REVIEW OF SYSTEMS: Skin:  No history of rash.  No history of abnormal moles. Infection:  No history of hepatitis or HIV.  No history of MRSA. Neurologic:  No history of seizure.  No history of headaches. Cardiac:  No history of hypertension. No  history of heart disease.  Pulmonary:  Does not smoke cigarettes.  No asthma or bronchitis.  No OSA/CPAP.  Endocrine:  No diabetes. No thyroid disease. Gastrointestinal:   See HPI. Urologic:  No history of kidney stones.  No history of bladder infections. Musculoskeletal:  No history of joint or back disease. Hematologic:  No bleeding disorder.  No history of anemia. Psycho-social:  The patient is oriented.   The patient has no obvious psychologic or social impairment to understanding our conversation and plan.  SOCIAL and FAMILY HISTORY: Not married, but her baby's father is involved. 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 Has children ages 42, 61, and 7.  PHYSICAL EXAM: BP 114/68  Pulse 63  Temp 97.8 F (36.6 C) (Oral)  Resp 18  Ht 5\' 5"  (1.651 m)  Wt 242 lb (109.77 kg)  BMI 40.27 kg/m2  LMP 08/29/2011  General: Obese AA F who is alert. HEENT: Normal. Pupils equal. Neck: Supple. No mass.  No thyroid mass. Lymph Nodes:  No supraclavicular or cervical nodes. Lungs: Clear to auscultation and symmetric breath sounds. Heart:  RRR. No murmur or rub.  Abdomen: Soft. Pregnant abdomen, though with her obesity, it is hard to tell where things end.  She has baby monitors on.  She points to her RUQ and right flank as the main location of her pain. Rectal: Not done. Extremities:  Good strength and ROM  in upper and lower extremities. Neurologic:  Grossly intact to motor and sensory function. Psychiatric: She is somewhat depressed by the pain. Behavior is normal.   DATA REVIEWED: [place] and labs.    Ovidio Kin, MD,  [facility] [place] [address]  [address]   [place], [place]    [address] Phone:  [phone] FAX:  [phone]

## 2012-05-06 NOTE — Progress Notes (Signed)
Dr Ambrose Mantle notified of patient current pain status 6/10, cmet and lipase and amylase result. Order to administer gi cocktail. Have hope, np discharge patient with pain medications.

## 2012-05-06 NOTE — MAU Note (Signed)
Dr. Ambrose Mantle into see patient plan of care changed with admit to antenatal unit for pain management and observation, Dr. Ambrose Mantle to consult with general surgery Dr. Ovidio Kin who is on call today.

## 2012-05-06 NOTE — Progress Notes (Signed)
Patient is brought in bye ems with c/o sudden onset sharp lower back pains. Denies any vaginal bleeding or lof. Reports good fetal movement. Denies dysuria. She has a scheduled repeat c-section at 39weeks.

## 2012-05-06 NOTE — Progress Notes (Signed)
Dr Ambrose Mantle notified of patient's ultrasound report ( positive gall stones), cbc, ua results and patient c/o unrelieved pain. Order to administer dilaudid 1mg  iv and call him other results (cmet, amylase and lipase pending)

## 2012-05-06 NOTE — MAU Note (Signed)
Pt states, " I woke up at 1045-0200 with pain in my low back that goes to my upper back and around to my abdomen. It stays and doesn't go away."

## 2012-05-06 NOTE — H&P (Signed)
NAMESHOLONDA, [name] NO.:  [id number]  MEDICAL RECORD NO.:  [id number]  LOCATION:  9152                          FACILITY:  [facility]  PHYSICIAN:  Malachi Pro. Ambrose Mantle, M.D. DATE OF BIRTH:  1980-11-20  DATE OF ADMISSION:  05/06/2012 DATE OF DISCHARGE:                             HISTORY & PHYSICAL   PRESENT ILLNESS:  This is a 31 year old black female, para 3-0-0-3, gravida 80, EDC June 09, 2012, who was admitted to the hospital at 35 weeks and 6 days gestation after acute onset of right upper quadrant abdominal pain at 2:00 am on June 05, 2012.  Blood group and type is AB positive.  Negative antibody.  Pap smear normal.  Rubella immune.  RPR nonreactive.  Urine culture negative.  Hepatitis B surface antigen negative.  HIV negative.  Hemoglobin electrophoresis AA.  GC and Chlamydia negative.  She was too advanced for 1st trimester screen.  She declined cystic fibrosis carrier screening, and had a negative quad screen.  1-hour Glucola was 113.  Group B strep is not reported.  The patient had an ultrasound on January 10, 2012 and that gave her an estimated gestational age of [redacted] weeks and 6 days with a due date of June 06, 2012.  Her menstrual history gave June 04, 2012 and that is the due date.  A repeat ultrasound on January 31, 2012, gestational age of [redacted] weeks and 2 days.  By the last period, she was 22 weeks and 1 day.  Her prenatal course was relatively uncomplicated.  She had her 1st visit on January 19, 2012 at 20 weeks and 3/7th.  She did have treatment for scabies on February 07, 2012.  At her last prenatal visit on April 25, 2012, she was doing well.  At 2:00 am on the day of admission, the patient had the onset of right upper abdominal pain.  She came to the maternity admission unit and a workup showed a large 2.2 cm gallstone that might be obstructing the duct.  There was no evidence of cholecystitis.  Serum amylase and lipase were normal.  Comprehensive metabolic  profile showed normal SGOT and PT and normal bilirubin and white count was 8500.  The patient's pain was significant enough that she was not sure she would be able to eat or drink anything, so I admitted her for pain control.  ALLERGIES:  Revealed allergy to penicillin.  She has no food allergies and no latex allergy.  PAST MEDICAL HISTORY:  She has no significant medical history other than morbid obesity.  She had her wisdom teeth extracted at age 9.  She had cesarean sections in 2006, 2008, and 2011 at 42 weeks and delivery of female infants weighing 5 pounds 5 ounces, 5.7, and 7.6.  SOCIAL HISTORY:  She denies tobacco, alcohol, and illicit substance abuse.  She works part time.  She is in a relationship.  FAMILY HISTORY:  Her brother has diabetes and high blood pressure.  Her mother has high blood pressure and diabetes.  Uncle has colon cancer and aunt has diabetes, and an aunt has mental retardation.  Mother had MI at age 31.  PHYSICAL EXAMINATION:  VITAL SIGNS:  Admission temperature is 97.8, pulse  63, respirations 18, blood pressure 114/68.  HEART:  Normal size and sounds.  No murmurs.  LUNGS:  Clear to auscultation.  ABDOMEN:  There is right upper quadrant direct tenderness.  There is no rebound. Fundal height is appropriate.  Fetal heart tones are normal. Cervix is not examined.  LABORATORY DATA:  Comprehensive metabolic profile showed an SGOT and PT of 15 and 5, bilirubin of 0.3, white count of 8300, hemoglobin 10.1, hematocrit 29.6, platelet count 139,000.  The ultrasound showed a 2.2 cm stone lodged at the neck of the gallbladder.  There was no evidence for cholecystitis.  No evidence or more distal obstruction.  ADMITTING IMPRESSION:  Intrauterine pregnancy at 35 weeks and 6 days, prior C-section x3.  Cholecystitis with significant abdominal pain.  The patient is admitted for pain control.  I have consulted with Dr. Ovidio Kin, General Surgery.  He will see the  patient.  Our plan will be to try to see if her pain will diminish with observation.  I have also consulted with Dr. Alpha Gula, Maternal Fetal Medicine at [facility].  He recommends that if surgery has to be done, the baby should be delivered by C-section, and then the cholecystectomy could be done at the time the surgeon chooses.  I am also going to suggest to Dr. Ezzard Standing that he consider cholecystostomy to see if that would relieve the patient's pain if pain medicine does not relieve it.     Malachi Pro. Ambrose Mantle, M.D.     TFH/MEDQ  D:  05/06/2012  T:  05/06/2012  Job:  [id number]

## 2012-05-07 LAB — COMPREHENSIVE METABOLIC PANEL
AST: 12 U/L (ref 0–37)
CO2: 26 mEq/L (ref 19–32)
Calcium: 8.6 mg/dL (ref 8.4–10.5)
Creatinine, Ser: 0.71 mg/dL (ref 0.50–1.10)
GFR calc non Af Amer: >90 mL/min (ref >90)
Total Protein: 5 g/dL — ABNORMAL LOW (ref 6.0–8.3)

## 2012-05-07 LAB — TYPE AND SCREEN

## 2012-05-07 LAB — DIFFERENTIAL
Basophils Absolute: 0 10*3/uL (ref 0.0–0.1)
Eosinophils Relative: 2 % (ref 0–5)
Lymphocytes Relative: 27 % (ref 12–46)
Neutro Abs: 3.8 10*3/uL (ref 1.7–7.7)

## 2012-05-07 LAB — CBC
MCV: 90.6 fL (ref 78.0–100.0)
Platelets: 122 10*3/uL — ABNORMAL LOW (ref 150–400)
RBC: 3.19 MIL/uL — ABNORMAL LOW (ref 3.87–5.11)
WBC: 5.9 10*3/uL (ref 4.0–10.5)

## 2012-05-07 LAB — ABO/RH: ABO/RH(D): AB POS

## 2012-05-07 LAB — AMYLASE: Amylase: 27 U/L (ref 0–105)

## 2012-05-07 NOTE — Progress Notes (Signed)
Low fat regular diet given, pt tolerated well after receiving zofran 4 mg IV for c/o nausea prior to meal.

## 2012-05-07 NOTE — Progress Notes (Signed)
Ur chart review completed.  

## 2012-05-07 NOTE — Progress Notes (Signed)
Incentive Spirometer at bedside.  Pt instructed on use and able to demonstrate achieving volume of 2200 initially.

## 2012-05-07 NOTE — Progress Notes (Signed)
Patient ID: [name], female   DOB: 13-Oct-1981, 30 y.o.   MRN: [id number] Labs are normal again She has eaten little. If she can tolerate food I will d/c her tomorrow. She states her pain is improved.

## 2012-05-08 MED ORDER — OXYCODONE-ACETAMINOPHEN 5-325 MG PO TABS: 1.0000 | ORAL_TABLET | Freq: Four times a day (QID) | ORAL | Status: DC | PRN

## 2012-05-08 NOTE — Progress Notes (Signed)
Patient ID: [name], female   DOB: 08/02/1981, 31 y.o.   MRN: [id number] 31 yo [id number] at 36+ admitted for pain control from gallstone.  +FM, no LOF, no VB - sm spotting after SVE, occ ctx.  Pain better controlled  AFVSS labs nl gen NAD Abd soft, FNT  D/c home with percocet and pain precautions, f/u office tomorrow as scheduled.  T/c poss early delivery with pain, will try for 39 weeks.  Encourage low fat diet.

## 2012-05-08 NOTE — Discharge Summary (Signed)
Obstetric Discharge Summary Reason for Admission: IUP at 35+, cholecystitis, pain control Prenatal Procedures: NST, surgery consult Intrapartum Procedures: N/A Postpartum Procedures: N/A Complications-Operative and Postpartum: none Hemoglobin  Date Value Range Status  05/07/2012 9.6* 12.0 - 15.0 g/dL Final     HCT  Date Value Range Status  05/07/2012 28.9* 36.0 - 46.0 % Final    Physical Exam:  General: alert  Uterine Fundus: FNT   Discharge Diagnoses: pain better controlled, gallstone  Discharge Information: Date: 05/08/2012 Activity: unrestricted Diet: routine and LOW FAT Medications: Percocet Condition: improved Instructions: appt 6/26, routine prenatal care Discharge to: home Follow-up Information    Follow up with BOVARD,Finas Delone, MD. Schedule an appointment as soon as possible for a visit in 1 day. (As scheduled)    Contact information:   [address] [facility] [address] [place] [address] [phone]          Newborn Data: This patient has no babies on file. Home with N/A (still pregnant).  BOVARD,Mckenna Gamm 05/08/2012, 7:43 AM

## 2012-05-09 ENCOUNTER — Encounter (HOSPITAL_COMMUNITY): Payer: Self-pay | Admitting: Pharmacy Technician

## 2012-05-09 ENCOUNTER — Inpatient Hospital Stay (HOSPITAL_COMMUNITY)
Admission: AD | Admit: 2012-05-09 | Payer: Medicaid Other | Source: Ambulatory Visit | Admitting: Obstetrics and Gynecology

## 2012-05-09 ENCOUNTER — Other Ambulatory Visit: Payer: Self-pay | Admitting: Obstetrics and Gynecology

## 2012-05-09 ENCOUNTER — Encounter (HOSPITAL_COMMUNITY): Payer: Self-pay | Admitting: Obstetrics and Gynecology

## 2012-05-09 ENCOUNTER — Ambulatory Visit (HOSPITAL_COMMUNITY): Payer: Medicaid Other

## 2012-05-09 ENCOUNTER — Inpatient Hospital Stay (HOSPITAL_COMMUNITY)
Admission: RE | Admit: 2012-05-09 | Discharge: 2012-05-14 | DRG: 766 | Disposition: A | Discharge status: Home / Self Care | Payer: Medicaid Other | Source: Ambulatory Visit | Attending: Obstetrics and Gynecology | Admitting: Obstetrics and Gynecology

## 2012-05-09 DIAGNOSIS — E669 Obesity, unspecified: Secondary | ICD-10-CM | POA: Diagnosis present

## 2012-05-09 DIAGNOSIS — Z302 Encounter for sterilization: Secondary | ICD-10-CM

## 2012-05-09 DIAGNOSIS — O26899 Other specified pregnancy related conditions, unspecified trimester: Principal | ICD-10-CM | POA: Diagnosis present

## 2012-05-09 DIAGNOSIS — K8020 Calculus of gallbladder without cholecystitis without obstruction: Secondary | ICD-10-CM

## 2012-05-09 DIAGNOSIS — Z98891 History of uterine scar from previous surgery: Secondary | ICD-10-CM

## 2012-05-09 DIAGNOSIS — Z3480 Encounter for supervision of other normal pregnancy, unspecified trimester: Secondary | ICD-10-CM

## 2012-05-09 HISTORY — DX: History of uterine scar from previous surgery: Z98.891

## 2012-05-09 HISTORY — DX: Encounter for supervision of other normal pregnancy, unspecified trimester: Z34.80

## 2012-05-09 HISTORY — DX: Calculus of gallbladder without cholecystitis without obstruction: K80.20

## 2012-05-09 LAB — COMPREHENSIVE METABOLIC PANEL
ALT: 6 U/L (ref 0–35)
Albumin: 2.6 g/dL — ABNORMAL LOW (ref 3.5–5.2)
Alkaline Phosphatase: 96 U/L (ref 39–117)
BUN: 6 mg/dL (ref 6–23)
Potassium: 3.7 mEq/L (ref 3.5–5.1)
Sodium: 138 mEq/L (ref 135–145)
Total Protein: 6.1 g/dL (ref 6.0–8.3)

## 2012-05-09 LAB — CBC
MCH: 30.7 pg (ref 26.0–34.0)
MCHC: 33.8 g/dL (ref 30.0–36.0)
MCV: 90.7 fL (ref 78.0–100.0)
Platelets: 151 10*3/uL (ref 150–400)
RDW: 13.9 % (ref 11.5–15.5)

## 2012-05-09 LAB — TYPE AND SCREEN
ABO/RH(D): AB POS
Antibody Screen: NEGATIVE

## 2012-05-09 MED ORDER — ONDANSETRON HCL 4 MG PO TABS
8.0000 mg | ORAL_TABLET | Freq: Three times a day (TID) | ORAL | Status: DC | PRN
  Administered 2012-05-09: 8 mg via ORAL
  Filled 2012-05-09: qty 2

## 2012-05-09 MED ORDER — ONDANSETRON HCL 4 MG/2ML IJ SOLN: 8.0000 mg | Freq: Three times a day (TID) | INTRAMUSCULAR | Status: DC | PRN

## 2012-05-09 MED ORDER — LACTATED RINGERS IV SOLN: INTRAVENOUS | Status: DC

## 2012-05-09 MED ORDER — HYDROMORPHONE HCL PF 1 MG/ML IJ SOLN
1.0000 mg | INTRAMUSCULAR | Status: DC | PRN
  Filled 2012-05-09: qty 1

## 2012-05-09 MED ORDER — ONDANSETRON 8 MG/NS 50 ML IVPB
8.0000 mg | Freq: Three times a day (TID) | INTRAVENOUS | Status: DC | PRN
  Filled 2012-05-09: qty 8

## 2012-05-09 MED ORDER — CALCIUM CARBONATE ANTACID 500 MG PO CHEW
400.0000 mg | CHEWABLE_TABLET | ORAL | Status: DC | PRN
  Administered 2012-05-09 – 2012-05-10 (×2): 400 mg via ORAL
  Filled 2012-05-09: qty 1

## 2012-05-09 MED ORDER — DEXTROSE 5 % IV SOLN
INTRAVENOUS | Status: AC
  Administered 2012-05-10: 13:00:00 via INTRAVENOUS
  Filled 2012-05-09: qty 2.5

## 2012-05-09 MED ORDER — PRENATAL MULTIVITAMIN CH: 1.0000 | ORAL_TABLET | Freq: Every day | ORAL | Status: DC

## 2012-05-09 MED ORDER — LACTATED RINGERS IV SOLN
INTRAVENOUS | Status: DC
  Administered 2012-05-09 – 2012-05-10 (×5): via INTRAVENOUS

## 2012-05-09 MED ORDER — ZOLPIDEM TARTRATE 10 MG PO TABS: 10.0000 mg | ORAL_TABLET | Freq: Every evening | ORAL | Status: DC | PRN

## 2012-05-09 MED ORDER — GENTAMICIN SULFATE 40 MG/ML IJ SOLN: INTRAVENOUS | Status: DC

## 2012-05-09 MED ORDER — HYDROMORPHONE HCL PF 1 MG/ML IJ SOLN: 1.0000 mg | INTRAMUSCULAR | Status: DC | PRN

## 2012-05-09 MED ORDER — CALCIUM CARBONATE ANTACID 500 MG PO CHEW
2.0000 | CHEWABLE_TABLET | ORAL | Status: DC | PRN
  Filled 2012-05-09: qty 2

## 2012-05-09 MED ORDER — CALCIUM CARBONATE ANTACID 500 MG PO CHEW: 2.0000 | CHEWABLE_TABLET | ORAL | Status: DC | PRN

## 2012-05-09 MED ORDER — HYDROMORPHONE HCL PF 1 MG/ML IJ SOLN
1.0000 mg | INTRAMUSCULAR | Status: DC | PRN
  Administered 2012-05-09 (×2): 1 mg via INTRAVENOUS
  Filled 2012-05-09: qty 1

## 2012-05-09 MED ORDER — DOCUSATE SODIUM 100 MG PO CAPS: 100.0000 mg | ORAL_CAPSULE | Freq: Every day | ORAL | Status: DC

## 2012-05-09 MED ORDER — ACETAMINOPHEN 325 MG PO TABS: 650.0000 mg | ORAL_TABLET | ORAL | Status: DC | PRN

## 2012-05-09 NOTE — H&P (Signed)
[name] is a 31 y.o. female [phone] at 88+2 with impacted gallstone and intractable pain.  Per MFM reccs pt to be delivered 6/27, rLTCS.  Will undergo cholecstectomy shortly thereafter, d/w general surgery.  Pt states +FM,  No LOF, no VB, occ ctx.  Describes pain as RUQ radiating in band to back.  Pt morbidly obese with large pannus will use midline vertical for delivery.  Also for BTL, d/w pt r/b/a of delivery, surgery and BTL.   Maternal Medical History:  Contractions: Frequency: irregular.    Fetal activity: Perceived fetal activity is normal.    Prenatal complications: Cholelithiasis.     OB History    Grav Para Term Preterm Abortions TAB SAB Ect Mult Living   4 3 3  0 0 0 0 0 0 3    term LTCS x 3; G3 with GDM, no abnormal pap, no STDs.  G4 late [facility] Past Medical History  Diagnosis Date  . Obesity   . Obesity   . GERD (gastroesophageal reflux disease)   . Gallstone (impacted) 05/09/2012  . Normal pregnancy, repeat 05/09/2012   Past Surgical History  Procedure Date  . Cesarean section   x3  Family History: family history includes Arthritis in her mother; Asthma in her mother; COPD in her mother; Diabetes in her maternal aunt, maternal grandmother, and mother; Hypertension in her mother; and Stroke in her maternal aunt, maternal grandmother, and paternal grandmother.  There is no history of Anesthesia problems, and Hypotension, and Malignant hyperthermia, and Pseudochol deficiency, . Social History:  reports that she has never smoked. She has never used smokeless tobacco. She reports that she does not drink alcohol or use illicit drugs. Meds PNV All PCN  Prenatal Transfer Tool  Maternal Diabetes: No Genetic Screening: Normal Maternal Ultrasounds/Referrals: Abnormal:  Findings:   Other: Please see prenatal record for details Fetal Ultrasounds or other Referrals:  Referred to Materal Fetal Medicine  OBESITY limits scan Maternal Substance Abuse:  No Significant Maternal  Medications:  None Significant Maternal Lab Results:  None Other Comments:  impacted gallstone, h/o LTCS x3, L PNC at 20 wk  Review of Systems  Constitutional: Negative.   HENT: Negative.   Eyes: Negative.   Respiratory: Negative.   Cardiovascular: Negative.   Gastrointestinal: Positive for abdominal pain.  Genitourinary: Negative.   Musculoskeletal: Negative.   Skin: Negative.   Neurological: Negative.   Psychiatric/Behavioral: Negative.       Blood pressure 109/50, pulse 81, temperature 98.4 F (36.9 C), temperature source Oral, resp. rate 20, height 5\' 5"  (1.651 m), weight 118.842 kg (262 lb), last menstrual period 08/29/2011. Maternal Exam:  Abdomen: Patient reports the following abdominal tenderness: RUQ.  Surgical scars: low transverse.   Fundal height is S>D.   Fetal presentation: vertex     Physical Exam  Constitutional: She is oriented to person, place, and time. She appears well-developed and well-nourished.  HENT:  Head: Normocephalic and atraumatic.  Eyes: Conjunctivae are normal. Pupils are equal, round, and reactive to light.  Neck: Normal range of motion. Neck supple.  Cardiovascular: Normal rate and regular rhythm.   Respiratory: Effort normal and breath sounds normal. No respiratory distress.  GI: Soft. Bowel sounds are normal. There is tenderness in the right upper quadrant.  Musculoskeletal: Normal range of motion.  Neurological: She is alert and oriented to person, place, and time.  Skin: Skin is warm and dry.  Psychiatric: She has a normal mood and affect. Her behavior is normal.  Prenatal labs: ABO, Rh: --/--/AB POS (06/24 0030) Antibody: NEG (06/24 0025) Rubella: Immune (03/07 1200) RPR: Nonreactive (03/07 1200)  HBsAg: Negative (03/07 1200)  HIV: Non-reactive (03/07 1200)  GBS:   unknown, tested 6/26/ in office Hgb 11.2/ Pap WNL/ Plt 188K/ Hgb electro WNL/ GC neg, Chl neg, CF declined, glucola 113/ AFP WNL/ Tdap 03/12/12   [place] cwd of LMP  at 18 wk, nl limited anat, fundal plac, female - f/u US limited by maternal weight  Assessment/Plan: 31yo [id number] at 36+ admitted with impacted gallstone for r CS through midline vertical incision 6/27.  Chole to follow.  Surgery to evaluate, made aware 6/26.  Nl labs.   BOVARD,Shahidah Nesbitt 05/09/2012, 5:51 PM

## 2012-05-10 ENCOUNTER — Encounter (HOSPITAL_COMMUNITY): Payer: Self-pay | Admitting: General Surgery

## 2012-05-10 ENCOUNTER — Encounter (HOSPITAL_COMMUNITY): Payer: Self-pay | Admitting: Anesthesiology

## 2012-05-10 ENCOUNTER — Encounter (HOSPITAL_COMMUNITY): Payer: Self-pay | Admitting: Obstetrics and Gynecology

## 2012-05-10 ENCOUNTER — Encounter (HOSPITAL_COMMUNITY)
Admission: RE | Disposition: A | Discharge status: Home / Self Care | Payer: Self-pay | Source: Ambulatory Visit | Attending: Obstetrics and Gynecology

## 2012-05-10 ENCOUNTER — Encounter (HOSPITAL_COMMUNITY): Payer: Self-pay

## 2012-05-10 ENCOUNTER — Inpatient Hospital Stay (HOSPITAL_COMMUNITY): Payer: Medicaid Other | Admitting: Anesthesiology

## 2012-05-10 ENCOUNTER — Ambulatory Visit (HOSPITAL_COMMUNITY): Admission: RE | Admit: 2012-05-10 | Payer: Medicaid Other | Source: Ambulatory Visit

## 2012-05-10 ENCOUNTER — Inpatient Hospital Stay (HOSPITAL_COMMUNITY): Payer: Medicaid Other

## 2012-05-10 DIAGNOSIS — Z98891 History of uterine scar from previous surgery: Secondary | ICD-10-CM

## 2012-05-10 HISTORY — DX: History of uterine scar from previous surgery: Z98.891

## 2012-05-10 LAB — CBC
Hemoglobin: 9.5 g/dL — ABNORMAL LOW (ref 12.0–15.0)
RBC: 3.16 MIL/uL — ABNORMAL LOW (ref 3.87–5.11)

## 2012-05-10 SURGERY — Surgical Case
Anesthesia: Spinal | Site: Abdomen | Laterality: Bilateral | Wound class: Clean Contaminated

## 2012-05-10 SURGERY — Surgical Case
Anesthesia: Spinal

## 2012-05-10 MED ORDER — SODIUM BICARBONATE 8.4 % IV SOLN
INTRAVENOUS | Status: DC | PRN
  Administered 2012-05-10: 5 mL via EPIDURAL

## 2012-05-10 MED ORDER — DIPHENHYDRAMINE HCL 25 MG PO CAPS
25.0000 mg | ORAL_CAPSULE | ORAL | Status: DC | PRN
  Administered 2012-05-12: 25 mg via ORAL
  Filled 2012-05-10: qty 1

## 2012-05-10 MED ORDER — PHENYLEPHRINE 40 MCG/ML (10ML) SYRINGE FOR IV PUSH (FOR BLOOD PRESSURE SUPPORT)
PREFILLED_SYRINGE | INTRAVENOUS | Status: AC
  Filled 2012-05-10: qty 25

## 2012-05-10 MED ORDER — ONDANSETRON HCL 4 MG/2ML IJ SOLN: 4.0000 mg | INTRAMUSCULAR | Status: DC | PRN

## 2012-05-10 MED ORDER — OXYTOCIN 10 UNIT/ML IJ SOLN
INTRAMUSCULAR | Status: AC
  Filled 2012-05-10: qty 4

## 2012-05-10 MED ORDER — HYDROMORPHONE HCL PF 1 MG/ML IJ SOLN
0.2500 mg | INTRAMUSCULAR | Status: DC | PRN
  Administered 2012-05-10 (×2): 0.5 mg via INTRAVENOUS

## 2012-05-10 MED ORDER — ZOLPIDEM TARTRATE 5 MG PO TABS: 5.0000 mg | ORAL_TABLET | Freq: Every evening | ORAL | Status: DC | PRN

## 2012-05-10 MED ORDER — KETOROLAC TROMETHAMINE 30 MG/ML IJ SOLN
INTRAMUSCULAR | Status: AC
  Filled 2012-05-10: qty 1

## 2012-05-10 MED ORDER — ONDANSETRON HCL 4 MG/2ML IJ SOLN: 4.0000 mg | Freq: Three times a day (TID) | INTRAMUSCULAR | Status: DC | PRN

## 2012-05-10 MED ORDER — MIDAZOLAM HCL 5 MG/5ML IJ SOLN
INTRAMUSCULAR | Status: DC | PRN
  Administered 2012-05-10: 1 mg via INTRAVENOUS

## 2012-05-10 MED ORDER — SCOPOLAMINE 1 MG/3DAYS TD PT72
1.0000 | MEDICATED_PATCH | Freq: Once | TRANSDERMAL | Status: AC
  Administered 2012-05-10: 1.5 mg via TRANSDERMAL

## 2012-05-10 MED ORDER — FENTANYL CITRATE 0.05 MG/ML IJ SOLN
INTRAMUSCULAR | Status: DC | PRN
  Administered 2012-05-10: 50 ug via INTRAVENOUS
  Administered 2012-05-10: 12.5 ug via INTRAVENOUS
  Administered 2012-05-10 (×2): 25 ug via INTRAVENOUS
  Administered 2012-05-10: 12.5 ug via INTRAVENOUS
  Administered 2012-05-10: 75 ug via INTRAVENOUS

## 2012-05-10 MED ORDER — FENTANYL CITRATE 0.05 MG/ML IJ SOLN
INTRAMUSCULAR | Status: AC
  Filled 2012-05-10: qty 2

## 2012-05-10 MED ORDER — IBUPROFEN 800 MG PO TABS
800.0000 mg | ORAL_TABLET | Freq: Three times a day (TID) | ORAL | Status: DC
  Administered 2012-05-10 – 2012-05-14 (×11): 800 mg via ORAL
  Filled 2012-05-10 (×11): qty 1

## 2012-05-10 MED ORDER — OXYTOCIN 40 UNITS IN LACTATED RINGERS INFUSION - SIMPLE MED: 62.5000 mL/h | INTRAVENOUS | Status: AC

## 2012-05-10 MED ORDER — PHENYLEPHRINE HCL 10 MG/ML IJ SOLN
INTRAMUSCULAR | Status: DC | PRN
  Administered 2012-05-10 (×2): 40 ug via INTRAVENOUS
  Administered 2012-05-10 (×4): 80 ug via INTRAVENOUS
  Administered 2012-05-10: 40 ug via INTRAVENOUS
  Administered 2012-05-10 (×4): 80 ug via INTRAVENOUS

## 2012-05-10 MED ORDER — HYDROMORPHONE HCL PF 1 MG/ML IJ SOLN
INTRAMUSCULAR | Status: AC
  Administered 2012-05-10: 0.5 mg via INTRAVENOUS
  Filled 2012-05-10: qty 1

## 2012-05-10 MED ORDER — SENNOSIDES-DOCUSATE SODIUM 8.6-50 MG PO TABS
2.0000 | ORAL_TABLET | Freq: Every day | ORAL | Status: DC
  Administered 2012-05-10 – 2012-05-13 (×4): 2 via ORAL

## 2012-05-10 MED ORDER — MORPHINE SULFATE 0.5 MG/ML IJ SOLN
INTRAMUSCULAR | Status: AC
  Filled 2012-05-10: qty 10

## 2012-05-10 MED ORDER — PROMETHAZINE HCL 25 MG/ML IJ SOLN: 6.2500 mg | INTRAMUSCULAR | Status: DC | PRN

## 2012-05-10 MED ORDER — NALOXONE HCL 0.4 MG/ML IJ SOLN: 1.0000 ug/kg/h | INTRAMUSCULAR | Status: DC | PRN

## 2012-05-10 MED ORDER — ONDANSETRON HCL 4 MG PO TABS: 4.0000 mg | ORAL_TABLET | ORAL | Status: DC | PRN

## 2012-05-10 MED ORDER — MEPERIDINE HCL 25 MG/ML IJ SOLN: 6.2500 mg | INTRAMUSCULAR | Status: DC | PRN

## 2012-05-10 MED ORDER — ONDANSETRON HCL 4 MG/2ML IJ SOLN
INTRAMUSCULAR | Status: DC | PRN
  Administered 2012-05-10: 4 mg via INTRAVENOUS

## 2012-05-10 MED ORDER — LANOLIN HYDROUS EX OINT: 1.0000 "application " | TOPICAL_OINTMENT | CUTANEOUS | Status: DC | PRN

## 2012-05-10 MED ORDER — SIMETHICONE 80 MG PO CHEW
80.0000 mg | CHEWABLE_TABLET | Freq: Three times a day (TID) | ORAL | Status: DC
  Administered 2012-05-10 – 2012-05-14 (×12): 80 mg via ORAL

## 2012-05-10 MED ORDER — PRENATAL MULTIVITAMIN CH
1.0000 | ORAL_TABLET | Freq: Every day | ORAL | Status: DC
  Administered 2012-05-11 – 2012-05-14 (×4): 1 via ORAL
  Filled 2012-05-10 (×4): qty 1

## 2012-05-10 MED ORDER — TETANUS-DIPHTH-ACELL PERTUSSIS 5-2.5-18.5 LF-MCG/0.5 IM SUSP: 0.5000 mL | Freq: Once | INTRAMUSCULAR | Status: DC

## 2012-05-10 MED ORDER — KETOROLAC TROMETHAMINE 30 MG/ML IJ SOLN: 30.0000 mg | Freq: Four times a day (QID) | INTRAMUSCULAR | Status: AC | PRN

## 2012-05-10 MED ORDER — NALBUPHINE HCL 10 MG/ML IJ SOLN: 5.0000 mg | INTRAMUSCULAR | Status: DC | PRN

## 2012-05-10 MED ORDER — DIPHENHYDRAMINE HCL 25 MG PO CAPS
25.0000 mg | ORAL_CAPSULE | Freq: Four times a day (QID) | ORAL | Status: DC | PRN
Start: 2012-05-10 — End: 2012-05-14

## 2012-05-10 MED ORDER — MORPHINE SULFATE (PF) 0.5 MG/ML IJ SOLN
INTRAMUSCULAR | Status: DC | PRN
  Administered 2012-05-10: 1 mg via INTRAVENOUS

## 2012-05-10 MED ORDER — BUPIVACAINE IN DEXTROSE 0.75-8.25 % IT SOLN
INTRATHECAL | Status: DC | PRN
  Administered 2012-05-10: 1.4 mL via INTRATHECAL

## 2012-05-10 MED ORDER — MIDAZOLAM HCL 2 MG/2ML IJ SOLN
INTRAMUSCULAR | Status: AC
  Filled 2012-05-10: qty 2

## 2012-05-10 MED ORDER — PRENATAL MULTIVITAMIN CH: 1.0000 | ORAL_TABLET | Freq: Every day | ORAL | Status: DC

## 2012-05-10 MED ORDER — METOCLOPRAMIDE HCL 5 MG/ML IJ SOLN: 10.0000 mg | Freq: Three times a day (TID) | INTRAMUSCULAR | Status: DC | PRN

## 2012-05-10 MED ORDER — EPHEDRINE 5 MG/ML INJ
INTRAVENOUS | Status: AC
  Filled 2012-05-10: qty 10

## 2012-05-10 MED ORDER — LACTATED RINGERS IV SOLN
INTRAVENOUS | Status: DC
  Administered 2012-05-11: 04:00:00 via INTRAVENOUS

## 2012-05-10 MED ORDER — MENTHOL 3 MG MT LOZG: 1.0000 | LOZENGE | OROMUCOSAL | Status: DC | PRN

## 2012-05-10 MED ORDER — SIMETHICONE 80 MG PO CHEW: 80.0000 mg | CHEWABLE_TABLET | ORAL | Status: DC | PRN

## 2012-05-10 MED ORDER — DIPHENHYDRAMINE HCL 50 MG/ML IJ SOLN
12.5000 mg | INTRAMUSCULAR | Status: DC | PRN
  Administered 2012-05-10: 12.5 mg via INTRAVENOUS
  Filled 2012-05-10: qty 1

## 2012-05-10 MED ORDER — OXYCODONE-ACETAMINOPHEN 5-325 MG PO TABS
1.0000 | ORAL_TABLET | ORAL | Status: DC | PRN
  Administered 2012-05-11: 2 via ORAL
  Administered 2012-05-11: 1 via ORAL
  Administered 2012-05-11 – 2012-05-13 (×3): 2 via ORAL
  Administered 2012-05-13 – 2012-05-14 (×2): 1 via ORAL
  Filled 2012-05-10 (×3): qty 2
  Filled 2012-05-10 (×2): qty 1
  Filled 2012-05-10: qty 2
  Filled 2012-05-10: qty 1
  Filled 2012-05-10: qty 2

## 2012-05-10 MED ORDER — NALBUPHINE HCL 10 MG/ML IJ SOLN
5.0000 mg | INTRAMUSCULAR | Status: DC | PRN
Start: 2012-05-10 — End: 2012-05-14

## 2012-05-10 MED ORDER — ONDANSETRON HCL 4 MG/2ML IJ SOLN
INTRAMUSCULAR | Status: AC
  Filled 2012-05-10: qty 2

## 2012-05-10 MED ORDER — SODIUM CHLORIDE 0.9 % IJ SOLN
3.0000 mL | INTRAMUSCULAR | Status: DC | PRN
  Administered 2012-05-13: 3 mL via INTRAVENOUS

## 2012-05-10 MED ORDER — DIPHENHYDRAMINE HCL 50 MG/ML IJ SOLN: 25.0000 mg | INTRAMUSCULAR | Status: DC | PRN

## 2012-05-10 MED ORDER — SCOPOLAMINE 1 MG/3DAYS TD PT72
MEDICATED_PATCH | TRANSDERMAL | Status: AC
  Filled 2012-05-10: qty 1

## 2012-05-10 MED ORDER — EPHEDRINE SULFATE 50 MG/ML IJ SOLN
INTRAMUSCULAR | Status: DC | PRN
  Administered 2012-05-10 (×3): 5 mg via INTRAVENOUS

## 2012-05-10 MED ORDER — OXYTOCIN 10 UNIT/ML IJ SOLN
40.0000 [IU] | INTRAVENOUS | Status: DC | PRN
  Administered 2012-05-10: 40 [IU] via INTRAVENOUS

## 2012-05-10 MED ORDER — NALOXONE HCL 0.4 MG/ML IJ SOLN: 0.4000 mg | INTRAMUSCULAR | Status: DC | PRN

## 2012-05-10 MED ORDER — WITCH HAZEL-GLYCERIN EX PADS: 1.0000 "application " | MEDICATED_PAD | CUTANEOUS | Status: DC | PRN

## 2012-05-10 MED ORDER — SODIUM BICARBONATE 8.4 % IV SOLN
INTRAVENOUS | Status: AC
  Filled 2012-05-10: qty 50

## 2012-05-10 MED ORDER — CITRIC ACID-SODIUM CITRATE 334-500 MG/5ML PO SOLN
ORAL | Status: AC
  Administered 2012-05-10: 30 mL
  Filled 2012-05-10: qty 15

## 2012-05-10 MED ORDER — KETOROLAC TROMETHAMINE 30 MG/ML IJ SOLN
15.0000 mg | Freq: Once | INTRAMUSCULAR | Status: AC | PRN
  Administered 2012-05-10: 30 mg via INTRAVENOUS

## 2012-05-10 MED ORDER — KETOROLAC TROMETHAMINE 60 MG/2ML IM SOLN: 60.0000 mg | Freq: Once | INTRAMUSCULAR | Status: AC | PRN

## 2012-05-10 MED ORDER — CHLOROPROCAINE HCL 3 % IJ SOLN
INTRAMUSCULAR | Status: AC
  Filled 2012-05-10: qty 20

## 2012-05-10 MED ORDER — DIBUCAINE 1 % RE OINT: 1.0000 "application " | TOPICAL_OINTMENT | RECTAL | Status: DC | PRN

## 2012-05-10 MED ORDER — MORPHINE SULFATE (PF) 0.5 MG/ML IJ SOLN
INTRAMUSCULAR | Status: DC | PRN
Start: 2012-05-10 — End: 2012-05-10
  Administered 2012-05-10: 4 mg via EPIDURAL

## 2012-05-10 SURGICAL SUPPLY — 29 items
CHLORAPREP W/TINT 26ML (MISCELLANEOUS) IMPLANT
CLOTH BEACON ORANGE TIMEOUT ST (SAFETY) IMPLANT
CONTAINER PREFILL 10% NBF 15ML (MISCELLANEOUS) IMPLANT
ELECT REM PT RETURN 9FT ADLT (ELECTROSURGICAL)
ELECTRODE REM PT RTRN 9FT ADLT (ELECTROSURGICAL) IMPLANT
EXTRACTOR VACUUM M CUP 4 TUBE (SUCTIONS) IMPLANT
GLOVE BIO SURGEON STRL SZ 6.5 (GLOVE) IMPLANT
GLOVE BIO SURGEON STRL SZ7 (GLOVE) IMPLANT
GOWN PREVENTION PLUS LG XLONG (DISPOSABLE) IMPLANT
KIT ABG SYR 3ML LUER SLIP (SYRINGE) IMPLANT
NEEDLE HYPO 25X5/8 SAFETYGLIDE (NEEDLE) IMPLANT
NS IRRIG 1000ML POUR BTL (IV SOLUTION) IMPLANT
PACK C SECTION WH (CUSTOM PROCEDURE TRAY) IMPLANT
RTRCTR C-SECT PINK 25CM LRG (MISCELLANEOUS) IMPLANT
SLEEVE SCD COMPRESS KNEE MED (MISCELLANEOUS) IMPLANT
STAPLER VISISTAT 35W (STAPLE) IMPLANT
SUT MNCRL 0 VIOLET CTX 36 (SUTURE) IMPLANT
SUT MONOCRYL 0 CTX 36 (SUTURE)
SUT PLAIN 1 NONE 54 (SUTURE) IMPLANT
SUT PLAIN 2 0 XLH (SUTURE) IMPLANT
SUT VIC AB 0 CT1 27 (SUTURE)
SUT VIC AB 0 CT1 27XBRD ANBCTR (SUTURE) IMPLANT
SUT VIC AB 2-0 CT1 27 (SUTURE)
SUT VIC AB 2-0 CT1 TAPERPNT 27 (SUTURE) IMPLANT
SUT VIC AB 3-0 FS2 27 (SUTURE) IMPLANT
SYR BULB IRRIGATION 50ML (SYRINGE) IMPLANT
TOWEL OR 17X24 6PK STRL BLUE (TOWEL DISPOSABLE) IMPLANT
TRAY FOLEY CATH 14FR (SET/KITS/TRAYS/PACK) IMPLANT
WATER STERILE IRR 1000ML POUR (IV SOLUTION) IMPLANT

## 2012-05-10 SURGICAL SUPPLY — 41 items
BENZOIN TINCTURE PRP APPL 2/3 (GAUZE/BANDAGES/DRESSINGS) ×2 IMPLANT
CHLORAPREP W/TINT 26ML (MISCELLANEOUS) ×2 IMPLANT
CLOTH BEACON ORANGE TIMEOUT ST (SAFETY) ×2 IMPLANT
CONTAINER PREFILL 10% NBF 15ML (MISCELLANEOUS) ×4 IMPLANT
DRESSING TELFA 8X3 (GAUZE/BANDAGES/DRESSINGS) ×2 IMPLANT
DRSG COVADERM 4X10 (GAUZE/BANDAGES/DRESSINGS) ×2 IMPLANT
ELECT REM PT RETURN 9FT ADLT (ELECTROSURGICAL) ×2
ELECTRODE REM PT RTRN 9FT ADLT (ELECTROSURGICAL) ×1 IMPLANT
EXTRACTOR VACUUM M CUP 4 TUBE (SUCTIONS) ×2 IMPLANT
GLOVE BIO SURGEON STRL SZ 6.5 (GLOVE) ×2 IMPLANT
GLOVE BIO SURGEON STRL SZ7 (GLOVE) ×2 IMPLANT
GOWN PREVENTION PLUS LG XLONG (DISPOSABLE) ×6 IMPLANT
HEMOSTAT SURGICEL 2X14 (HEMOSTASIS) ×2 IMPLANT
HEMOSTAT SURGICEL 4X8 (HEMOSTASIS) ×2 IMPLANT
NS IRRIG 1000ML POUR BTL (IV SOLUTION) ×2 IMPLANT
PACK C SECTION WH (CUSTOM PROCEDURE TRAY) ×2 IMPLANT
PAD ABD 7.5X8 STRL (GAUZE/BANDAGES/DRESSINGS) ×2 IMPLANT
RTRCTR C-SECT PINK 25CM LRG (MISCELLANEOUS) ×2 IMPLANT
SLEEVE SCD COMPRESS KNEE MED (MISCELLANEOUS) IMPLANT
SPONGE GAUZE 4X4 12PLY (GAUZE/BANDAGES/DRESSINGS) ×2 IMPLANT
SPONGE LAP 18X18 X RAY DECT (DISPOSABLE) ×2 IMPLANT
STAPLER VISISTAT 35W (STAPLE) IMPLANT
STRIP CLOSURE SKIN 1/2X4 (GAUZE/BANDAGES/DRESSINGS) ×2 IMPLANT
SUT MNCRL 0 VIOLET CTX 36 (SUTURE) ×3 IMPLANT
SUT MNCRL AB 0 CT1 27 (SUTURE) ×2 IMPLANT
SUT MNCRL AB 4-0 PS2 18 (SUTURE) ×2 IMPLANT
SUT MONOCRYL 0 CTX 36 (SUTURE) ×3
SUT PDS AB 0 CTX 60 (SUTURE) ×2 IMPLANT
SUT PLAIN 1 NONE 54 (SUTURE) IMPLANT
SUT PLAIN 2 0 XLH (SUTURE) ×4 IMPLANT
SUT VIC AB 0 CT1 27 (SUTURE) ×2
SUT VIC AB 0 CT1 27XBRD ANBCTR (SUTURE) ×2 IMPLANT
SUT VIC AB 2-0 CT1 27 (SUTURE) ×1
SUT VIC AB 2-0 CT1 TAPERPNT 27 (SUTURE) ×1 IMPLANT
SUT VIC AB 3-0 SH 27 (SUTURE) ×1
SUT VIC AB 3-0 SH 27X BRD (SUTURE) ×1 IMPLANT
SYR BULB IRRIGATION 50ML (SYRINGE) ×2 IMPLANT
TAPE CLOTH SURG 4X10 WHT LF (GAUZE/BANDAGES/DRESSINGS) ×2 IMPLANT
TOWEL OR 17X24 6PK STRL BLUE (TOWEL DISPOSABLE) ×4 IMPLANT
TRAY FOLEY CATH 14FR (SET/KITS/TRAYS/PACK) ×2 IMPLANT
WATER STERILE IRR 1000ML POUR (IV SOLUTION) ×2 IMPLANT

## 2012-05-10 NOTE — Anesthesia Preprocedure Evaluation (Signed)
Anesthesia Evaluation  Patient identified by MRN, date of birth, ID band Patient awake    Reviewed: Allergy & Precautions, H&P , NPO status , Patient's Chart, lab work & pertinent test results  Airway Mallampati: III TM Distance: >3 FB Neck ROM: full    Dental No notable dental hx.    Pulmonary neg pulmonary ROS,  breath sounds clear to auscultation  Pulmonary exam normal       Cardiovascular negative cardio ROS      Neuro/Psych negative neurological ROS  negative psych ROS   GI/Hepatic Neg liver ROS, GERD-  ,  Endo/Other  Morbid obesity  Renal/GU negative Renal ROS  negative genitourinary   Musculoskeletal negative musculoskeletal ROS (+)   Abdominal (+) + obese,   Peds negative pediatric ROS (+)  Hematology negative hematology ROS (+)   Anesthesia Other Findings Obesity     Obesity        GERD (gastroesophageal reflux disease)     Gallstone (impacted) 05/09/2012      Normal pregnancy, repeat 6/26/    Reproductive/Obstetrics (+) Pregnancy                           Anesthesia Physical Anesthesia Plan  ASA: III  Anesthesia Plan: Spinal   Post-op Pain Management:    Induction:   Airway Management Planned:   Additional Equipment:   Intra-op Plan:   Post-operative Plan:   Informed Consent: I have reviewed the patients History and Physical, chart, labs and discussed the procedure including the risks, benefits and alternatives for the proposed anesthesia with the patient or authorized representative who has indicated his/her understanding and acceptance.     Plan Discussed with: CRNA and Surgeon  Anesthesia Plan Comments:         Anesthesia Quick Evaluation

## 2012-05-10 NOTE — Brief Op Note (Signed)
05/09/2012 - 05/10/2012  3:09 PM  PATIENT:  [name]  31 y.o. female  PRE-OPERATIVE DIAGNOSIS:  Previous Cesarean Section; Desires Sterilization, IUP at 36+,impacted gallstone with intractable pain  POST-OPERATIVE DIAGNOSIS:  Previous Cesarean Section; Desires Sterilization, IUP @ 36+, impacted gallstone with intractable pain  PROCEDURE:  Procedure(s) (LRB): CESAREAN SECTION WITH BILATERAL TUBAL LIGATION (Bilateral) by modified pomeroy method  SURGEON:  Surgeon(s) and Role:    * Sherron Monday, MD - Primary  ASSISTANTS: Lavina Hamman, MD   ANESTHESIA:  30 cc nesicane local and spinal  EBL:  Total I/O In: 2800 [I.V.:2800] Out: 1400 [Urine:300; Blood:1100]  FINDINGS: viable female infant at 13:29, apgars 6/9, wt P, omental ant abd wall adhesion, nl uterus, tube on left and ovary on left; r tube and ovary adhesed to sidewall  BLOOD ADMINISTERED:none  DRAINS: Urinary Catheter (Foley)   LOCAL MEDICATIONS USED:  Amount: 30 ml and OTHER nesicaine  SPECIMEN:  Source of Specimen:  Placenta and B tubal segments  DISPOSITION OF SPECIMEN:  L&D and Pathology  COUNTS:  YES  TOURNIQUET:  * No tourniquets in log *  DICTATION: .Other Dictation: Dictation Number [id number]  PLAN OF CARE: Admit to inpatient   PATIENT DISPOSITION:  PACU - hemodynamically stable.   Delay start of Pharmacological VTE agent (>24hrs) due to surgical blood loss or risk of bleeding: no

## 2012-05-10 NOTE — Anesthesia Preprocedure Evaluation (Signed)
Anesthesia Evaluation  Patient identified by MRN, date of birth, ID band Patient awake    Reviewed: Allergy & Precautions, H&P , NPO status , Patient's Chart, lab work & pertinent test results  Airway Mallampati: III TM Distance: >3 FB Neck ROM: full    Dental No notable dental hx.    Pulmonary neg pulmonary ROS,  breath sounds clear to auscultation  Pulmonary exam normal       Cardiovascular negative cardio ROS      Neuro/Psych negative neurological ROS  negative psych ROS   GI/Hepatic Neg liver ROS, GERD-  ,  Endo/Other  Morbid obesity  Renal/GU negative Renal ROS  negative genitourinary   Musculoskeletal negative musculoskeletal ROS (+)   Abdominal (+) + obese,   Peds negative pediatric ROS (+)  Hematology negative hematology ROS (+)   Anesthesia Other Findings Obesity     Obesity        GERD (gastroesophageal reflux disease)     Gallstone (impacted) 05/09/2012      Normal pregnancy, repeat 6/26/    Reproductive/Obstetrics (+) Pregnancy                           Anesthesia Physical Anesthesia Plan  ASA: III  Anesthesia Plan: Spinal   Post-op Pain Management:    Induction:   Airway Management Planned:   Additional Equipment:   Intra-op Plan:   Post-operative Plan:   Informed Consent: I have reviewed the patients History and Physical, chart, labs and discussed the procedure including the risks, benefits and alternatives for the proposed anesthesia with the patient or authorized representative who has indicated his/her understanding and acceptance.     Plan Discussed with: CRNA and Surgeon  Anesthesia Plan Comments:         Anesthesia Quick Evaluation  

## 2012-05-10 NOTE — Transfer of Care (Signed)
Immediate Anesthesia Transfer of Care Note  Patient: [name]  Procedure(s) Performed: Procedure(s) (LRB): CESAREAN SECTION WITH BILATERAL TUBAL LIGATION (Bilateral)  Patient Location: PACU  Anesthesia Type: Spinal  Level of Consciousness: awake, alert  and oriented  Airway & Oxygen Therapy: Patient Spontanous Breathing  Post-op Assessment: Report given to PACU RN and Post -op Vital signs reviewed and stable  Post vital signs: Reviewed and stable  Complications: No apparent anesthesia complications

## 2012-05-10 NOTE — Consult Note (Signed)
Reason for Consult:abd pain Referring Physician: Dr. Tonye Royalty is an 31 y.o. female.  HPI: 31 yo bf who is almost [redacted] weeks pregnant who has been having ruq pain. She was recently admitted and found to have a large gallstone in her gallbladder. She is readmitted now with a recurrence of her pain. The ob doc plans to do a c section later today.  Past Medical History  Diagnosis Date  . Obesity   . Obesity   . GERD (gastroesophageal reflux disease)   . Gallstone (impacted) 05/09/2012  . Normal pregnancy, repeat 05/09/2012    Past Surgical History  Procedure Date  . Cesarean section     Family History  Problem Relation Age of Onset  . Anesthesia problems Neg Hx   . Hypotension Neg Hx   . Malignant hyperthermia Neg Hx   . Pseudochol deficiency Neg Hx   . Asthma Mother   . COPD Mother   . Arthritis Mother   . Diabetes Mother   . Hypertension Mother   . Stroke Maternal Aunt   . Diabetes Maternal Aunt   . Stroke Maternal Grandmother   . Diabetes Maternal Grandmother   . Stroke Paternal Grandmother     Social History:  reports that she has never smoked. She has never used smokeless tobacco. She reports that she does not drink alcohol or use illicit drugs.  Allergies:  Allergies  Allergen Reactions  . Amoxicillin Hives  . Penicillins Hives    Medications: I have reviewed the patient's current medications.  Results for orders placed during the hospital encounter of 05/09/12 (from the past 48 hour(s))  TYPE AND SCREEN     Status: Normal   Collection Time   05/09/12  5:40 PM      Component Value Range Comment   ABO/RH(D) AB POS      Antibody Screen NEG      Sample Expiration 05/12/2012     COMPREHENSIVE METABOLIC PANEL     Status: Abnormal   Collection Time   05/09/12  5:40 PM      Component Value Range Comment   Sodium 138  135 - 145 mEq/L    Potassium 3.7  3.5 - 5.1 mEq/L    Chloride 105  96 - 112 mEq/L    CO2 25  19 - 32 mEq/L    Glucose, Bld 86   70 - 99 mg/dL    BUN 6  6 - 23 mg/dL    Creatinine, Ser 4.54  0.50 - 1.10 mg/dL    Calcium 8.9  8.4 - 09.8 mg/dL    Total Protein 6.1  6.0 - 8.3 g/dL    Albumin 2.6 (*) 3.5 - 5.2 g/dL    AST 16  0 - 37 U/L    ALT 6  0 - 35 U/L    Alkaline Phosphatase 96  39 - 117 U/L    Total Bilirubin 0.3  0.3 - 1.2 mg/dL    GFR calc non Af Amer >90  >90 mL/min    GFR calc Af Amer >90  >90 mL/min   CBC     Status: Abnormal   Collection Time   05/09/12  5:40 PM      Component Value Range Comment   WBC 8.2  4.0 - 10.5 K/uL    RBC 3.23 (*) 3.87 - 5.11 MIL/uL    Hemoglobin 9.9 (*) 12.0 - 15.0 g/dL    HCT 11.9 (*) 14.7 - 46.0 %  MCV 90.7  78.0 - 100.0 fL    MCH 30.7  26.0 - 34.0 pg    MCHC 33.8  30.0 - 36.0 g/dL    RDW 16.1  09.6 - 04.5 %    Platelets 151  150 - 400 K/uL   CBC     Status: Abnormal   Collection Time   05/10/12  5:20 AM      Component Value Range Comment   WBC 6.9  4.0 - 10.5 K/uL    RBC 3.16 (*) 3.87 - 5.11 MIL/uL    Hemoglobin 9.5 (*) 12.0 - 15.0 g/dL    HCT 40.9 (*) 81.1 - 46.0 %    MCV 90.8  78.0 - 100.0 fL    MCH 30.1  26.0 - 34.0 pg    MCHC 33.1  30.0 - 36.0 g/dL    RDW 91.4  78.2 - 95.6 %    Platelets 135 (*) 150 - 400 K/uL   MRSA PCR SCREENING     Status: Normal   Collection Time   05/10/12  7:55 AM      Component Value Range Comment   MRSA by PCR NEGATIVE  NEGATIVE     No results found.  Review of Systems  Constitutional: Negative.   HENT: Negative.   Eyes: Negative.   Respiratory: Negative.   Cardiovascular: Negative.   Gastrointestinal: Positive for abdominal pain.  Genitourinary: Negative.   Musculoskeletal: Negative.   Skin: Negative.   Neurological: Negative.   Endo/Heme/Allergies: Negative.   Psychiatric/Behavioral: Negative.    Blood pressure 114/64, pulse 73, temperature 97.7 F (36.5 C), temperature source Oral, resp. rate 20, height 5\' 5"  (1.651 m), weight 262 lb (118.842 kg), last menstrual period 08/29/2011. Physical Exam  Constitutional:  She is oriented to person, place, and time. She appears well-developed and well-nourished.  HENT:  Head: Normocephalic and atraumatic.  Eyes: Conjunctivae and EOM are normal. Pupils are equal, round, and reactive to light.  Neck: Normal range of motion. Neck supple.  Cardiovascular: Normal rate, regular rhythm and normal heart sounds.   Respiratory: Effort normal and breath sounds normal.  GI: Soft. Bowel sounds are normal.       Nontender. Almost [redacted] weeks pregnant  Musculoskeletal: Normal range of motion.  Neurological: She is alert and oriented to person, place, and time.  Skin: Skin is warm and dry.  Psychiatric: She has a normal mood and affect. Her behavior is normal.    Assessment/Plan: She has a probable symptomatic gallstone but is also near the end of her pregnancy. She is scheduled for a c section later today. We will plan to follow her after the delivery and if she continues to have pain we may need to plan to remove the gallbladder during this admission. We will follow closely with you  TOTH III,Lamyiah Crawshaw S 05/10/2012, 9:37 AM

## 2012-05-10 NOTE — Progress Notes (Signed)
Patient ID: [name], female   DOB: May 10, 1981, 31 y.o.   MRN: [id number] Pt feels fine VS are stable. Urine is clear.

## 2012-05-10 NOTE — Progress Notes (Signed)
Patient educated on how to cough and deep breathe, IS given and educated on how to use it. Patient verbalized understanding.

## 2012-05-10 NOTE — Anesthesia Procedure Notes (Signed)
Spinal  Patient location during procedure: OR Start time: 05/10/2012 1:07 PM End time: 05/10/2012 1:15 PM Reason for block: procedure for pain Staffing Anesthesiologist: Sandrea Hughs Performed by: anesthesiologist  Preanesthetic Checklist Completed: patient identified, site marked, surgical consent, pre-op evaluation, timeout performed, IV checked, risks and benefits discussed and monitors and equipment checked Spinal Block Patient position: sitting Prep: DuraPrep Patient monitoring: heart rate, cardiac monitor, continuous pulse ox and blood pressure Approach: midline Location: L3-4 Injection technique: single-shot Needle Needle type: Sprotte  Needle gauge: 24 G Needle length: 9 cm Assessment Sensory level: T8

## 2012-05-10 NOTE — Anesthesia Postprocedure Evaluation (Signed)
Anesthesia Post Note  Patient: [name]  Procedure(s) Performed: Procedure(s) (LRB): CESAREAN SECTION WITH BILATERAL TUBAL LIGATION (Bilateral)  Anesthesia type: Spinal  Patient location: PACU  Post pain: Pain level controlled  Post assessment: Post-op Vital signs reviewed  Last Vitals:  Filed Vitals:   05/10/12 1530  BP: 113/59  Pulse: 65  Temp:   Resp: 16    Post vital signs: Reviewed  Level of consciousness: awake  Complications: No apparent anesthesia complications

## 2012-05-10 NOTE — Progress Notes (Signed)
Patient ID: [name], female   DOB: [birth year], 31 y.o.   MRN: [id number] at 36+3 with impacted gallstone for r LTCS/BTL today.  Scheduled at 12:30, d/w pt r/b/a. General surgery to see today.  AFVSS gen NAD FHTs R NST toco occ

## 2012-05-11 ENCOUNTER — Encounter (HOSPITAL_COMMUNITY): Payer: Self-pay | Admitting: Obstetrics and Gynecology

## 2012-05-11 LAB — CBC
MCH: 29.8 pg (ref 26.0–34.0)
MCHC: 32.8 g/dL (ref 30.0–36.0)
Platelets: 148 10*3/uL — ABNORMAL LOW (ref 150–400)
RBC: 2.82 MIL/uL — ABNORMAL LOW (ref 3.87–5.11)
RDW: 14.1 % (ref 11.5–15.5)

## 2012-05-11 NOTE — Clinical Social Work Maternal (Signed)
Clinical Social Work Department PSYCHOSOCIAL ASSESSMENT - MATERNAL/CHILD 05/11/2012  Patient:  GEETA, DWORKIN  Account Number:  [id number]  Admit Date:  05/09/2012  Marjo Bicker Name:   Dudley Major    Clinical Social Worker:  Lulu Riding, LCSW   Date/Time:  05/11/2012 11:15 AM  Date Referred:  05/11/2012   Referral source  NICU     Referred reason  NICU   Other referral source:    I:  FAMILY / HOME ENVIRONMENT Child's legal guardian:  PARENT  Guardian - Name Guardian - Age Guardian - Address  Natashia Roseman 31 on file  Hakim Wiggins  same   Other household support members/support persons Name Relationship DOB    7    5    1    Other support:   MOB's family lives in [place].  FOB's family lives in [place] and [place]    II  PSYCHOSOCIAL DATA Information Source:  Patient Interview  Event organiser Employment:   Financial resources:  [employer] If Medicaid - [employer]:  [place] Other  [facility]   School / Grade:   Maternity Care Coordinator / Child Services Coordination / Early Interventions:  Cultural issues impacting care:   none known    III  STRENGTHS Strengths  Adequate Resources  Compliance with medical plan  Home prepared for Child (including basic supplies)  Other - See comment  Supportive family/friends  Understanding of illness   Strength comment:  Pediatric follow up will be at [facility]   IV  RISK FACTORS AND CURRENT PROBLEMS Current Problem:  None   Risk Factor & Current Problem Patient Issue Family Issue Risk Factor / Current Problem Comment   N N     V  SOCIAL WORK ASSESSMENT SW met with MOB in her third floor room to introduce myself, complete assessment and evaluate how family is coping with baby's admission to NICU.  MOB seemed very tired, but stated she was doing okay, experiencing some back pain and that SW could come in and talk with her.  She seemed to appreciate SW's visit and informed SW that she will  have to have her gallbladder removed sometime next week.  She states that her significant other is very involved and supportive and is currently taking care of their children and her 45 year old while she has been in the hospital.  She states that he will continue to complete the responsibilities at home when she has her surgery and therefore, family visitation may be limited while baby is in NICU.  SW encouraged MOB to select a friend or family member who can visit the baby on their behalf and to call whenever possible for updates.  She agreed.  She states they have all supplies needed at home.  She seems to have a good understanding of why baby is in NICU, although SW suspects MOB may be shocked if baby is ready for discharge before his due date.  SW explained that we like for parents to keep their due date in mind when thinking about discharge in hopes of reducing let down and disappointment, but to also prepare for the possibility that baby will not need the entire time to reach adjusted gestation before discharge.  SW explained support services offered by NICU SW and gave contact information.      VI SOCIAL WORK PLAN Social Work Plan  Psychosocial Support/Ongoing Assessment of Needs   Type of pt/family education:   What to expect from the NICU experience  If child protective services report - county:   If child protective services report - date:   Information/referral to community resources comment:   None identified at this time.   Other social work plan:

## 2012-05-11 NOTE — Anesthesia Postprocedure Evaluation (Signed)
  Anesthesia Post-op Note  Patient: [name]  Procedure(s) Performed: Procedure(s) (LRB): CESAREAN SECTION WITH BILATERAL TUBAL LIGATION (Bilateral)  Patient Location: [facility]  Anesthesia Type: Epidural  Level of Consciousness: awake, alert  and oriented  Airway and Oxygen Therapy: Patient Spontanous Breathing  Post-op Pain: none  Post-op Assessment: Post-op Vital signs reviewed and Patient's Cardiovascular Status Stable  Post-op Vital Signs: Reviewed and stable  Complications: No apparent anesthesia complications

## 2012-05-11 NOTE — Progress Notes (Signed)
1 Day Post-Op  Subjective: C section seems to have gone well. She complains of mild ruq pain. Nausea but no vomiting  Objective: Vital signs in last 24 hours: Temp:  [97.4 F (36.3 C)-98.4 F (36.9 C)] 98.4 F (36.9 C) (06/28 1400) Pulse Rate:  [58-78] 71  (06/28 1400) Resp:  [13-22] 18  (06/28 1400) BP: (92-132)/(53-74) 111/73 mmHg (06/28 1400) SpO2:  [95 %-100 %] 96 % (06/28 1400) Weight:  [262 lb (118.842 kg)] 262 lb (118.842 kg) (06/27 2003)    Intake/Output from previous day: 06/27 0701 - 06/28 0700 In: 3260 [P.O.:360; I.V.:2900] Out: 2300 [Urine:1200; Blood:1100] Intake/Output this shift: Total I/O In: -  Out: 450 [Urine:450]  GI: soft. midline dressing clean  Lab Results:   Basename 05/11/12 0523 05/10/12 0520  WBC 8.0 6.9  HGB 8.4* 9.5*  HCT 25.6* 28.7*  PLT 148* 135*   BMET  Basename 05/09/12 1740  NA 138  K 3.7  CL 105  CO2 25  GLUCOSE 86  BUN 6  CREATININE 0.84  CALCIUM 8.9   PT/INR No results found for this basename: LABPROT:2,INR:2 in the last 72 hours ABG No results found for this basename: PHART:2,PCO2:2,PO2:2,HCO3:2 in the last 72 hours  Studies/Results: No results found.  Anti-infectives: Anti-infectives     Start     Dose/Rate Route Frequency Ordered Stop   05/10/12 1200   gentamicin (GARAMYCIN) 100 mg, clindamycin (CLEOCIN) 900 mg in dextrose 5 % 100 mL IVPB        200 mL/hr over 30 Minutes Intravenous On call to O.R. 05/09/12 1213 05/10/12 1310   05/10/12 0600   gentamicin (GARAMYCIN) 100 mg, clindamycin (CLEOCIN) 900 mg in dextrose 5 % 100 mL IVPB  Status:  Discontinued        200 mL/hr over 30 Minutes Intravenous On call to O.R. 05/09/12 1706 05/09/12 1713          Assessment/Plan: s/p Procedure(s) (LRB): CESAREAN SECTION WITH BILATERAL TUBAL LIGATION (Bilateral) Lap chole may be difficult with location of her incision. Will follow over weekend and will discuss timing of next surgery  LOS: 2 days    TOTH III,Mccormick Macon  S 05/11/2012

## 2012-05-11 NOTE — Progress Notes (Signed)
UR Chart review completed.  

## 2012-05-11 NOTE — Op Note (Signed)
NAMECARIA, TRANSUE         ACCOUNT NO.:  [id number]  MEDICAL RECORD NO.:  [id number]  LOCATION:  9309                          FACILITY:  [facility]  PHYSICIAN:  Sherron Monday, MD        DATE OF BIRTH:  May 03, 1981  DATE OF PROCEDURE:  05/10/2012 DATE OF DISCHARGE:                              OPERATIVE REPORT   PREOPERATIVE DIAGNOSES: 1. Intrauterine pregnancy at 36 plus weeks. 2. Previous cesarean section, desires sterilization. 3. Impacted gallstone with intractable pain. 4. Cesarean section on advice of Maternal Fetal Medicine after     admitted for pain control and surgery not being able to perform     procedure prior to delivery.  POSTOPERATIVE DIAGNOSES: 1. Intrauterine pregnancy at 36 plus weeks. 2. Previous cesarean section, desires sterilization. 3. Impacted gallstone with intractable pain. 4. Cesarean section on advice of Maternal Fetal Medicine after     admitted for pain control and surgery not being able to perform     procedure prior to delivery.  PROCEDURE:  Transverse cesarean section with bilateral tubal ligation by modified Pomeroy method through periumbilical vertical incision.  SURGEON:  Sherron Monday, MD.  ASSISTANT:  Zenaida Niece, M.D.  ANESTHESIA:  30 mL Nesacaine for local anesthetic and spinal.  IV FLUIDS:  2800 mL.  URINE OUTPUT:  300 mL, clear urine at the end of the procedure.  ESTIMATED BLOOD LOSS:  1100 mL.  FINDINGS:  Viable female infant at 82 with Apgars of 6 at 1 minute, 9 at 5 minutes, and weight was pending at the time of indication.  Omental to anterior abdominal wall adhesions.  Normal uterus.  Normal tube and ovary on the left.  Right tube and ovary adhesed to sidewall.  PATHOLOGY:  Tubal segments to Pathology.  Placenta to Labor and Delivery.  COMPLICATIONS:  None.  DESCRIPTION OF PROCEDURE:  After informed consent was reviewed with the patient including risks, benefits, and alternatives of surgical procedure, she was  transported to the OR, where anesthesia was placed and found to be adequate.  She was then returned to the supine position with a leftward tilt.  She was prepped and draped in the normal sterile fashion for a periumbilical midline vertical incision.  Foley catheter was sterilely placed.  Incision was made, carried through to the underlying layer of fascia sharply.  The fascia was incised and incision was extended superiorly and inferiorly with direct visualization.  The peritoneum was entered bluntly.  The Alexis skin retractor was placed. The level of the incision was middle of the uterus.  The Alexis skin retractor was placed as well as a bladder blade was used to remove the field of vision to the lower uterine segment.  Lower uterine segment was incised in a transverse fashion.  The incision was extended with bandage scissors.  This was noted to be thick muscle and not the lower uterine segment.  The infant was delivered from a vertex presentation with aid of a vacuum.  Nose and mouth were suctioned.  Cord was clamped and cut. Infant was handed off to the awaiting Pediatric staff.  The placenta was expressed.  Uterus was cleared of all clot and debris.  Uterine incision was closed with  2 layers of 0 Monocryl, the first of which was running locked and the second as an imbricating layer.  Another layer of 0 Monocryl was placed at the right aspect of the incision to make it hemostatic as well as a several interrupted sutures of 3-0 Vicryl to make hemostatic.  Surgicel was also applied to the incision.  Copious pelvic irrigation was done.  The left tube was easily identified, followed out to its fimbriated end and ligated in a modified Pomeroy fashion.  The right tube was more difficult to identify, however, with the aid of incising an adhesion and with visualization, the tube was identified and ligated in a Pomeroy fashion with plain gut.  Our inspection was found that was hemostatic.  The  fascia was closed with 0 PDS in a running fashion as a bulk closure.  The subcuticular adipose layer was made hemostatic with Bovie cautery, irrigated and the dead space was closed with plain gut.  The skin was closed with 4-0 Monocryl in a subcuticular fashion.  Benzoin and Steri-Strips were applied. Sponge, lap, and needle count was correct x2 at the end of procedure per the operating room staff.     Sherron Monday, MD     JB/MEDQ  D:  05/10/2012  T:  05/11/2012  Job:  [id number]

## 2012-05-11 NOTE — Progress Notes (Signed)
Pt started on breast pump. First time pumping. Stayed on for .  Needs a [employer]  appt

## 2012-05-11 NOTE — Progress Notes (Signed)
Subjective: Postpartum Day 1: Cesarean Delivery Patient reports incisional pain, tolerating PO and no problems voiding.  Pain controlled, gallstone pain ?some better, but incision is sore, especially on left  Objective: Vital signs in last 24 hours: Temp:  [97.4 F (36.3 C)-98.1 F (36.7 C)] 97.9 F (36.6 C) (06/28 0500) Pulse Rate:  [57-93] 73  (06/28 0500) Resp:  [13-24] 18  (06/28 0500) BP: (88-132)/(50-75) 92/53 mmHg (06/28 0500) SpO2:  [95 %-100 %] 96 % (06/28 0500) Weight:  [161.096 kg (262 lb)] 118.842 kg (262 lb) (06/27 2003)  Physical Exam:  General: alert and no distress Lochia: appropriate Uterine Fundus: firm Incision: bandage intact  DVT Evaluation: No evidence of DVT seen on physical exam. CV RRR Lungs CTAB Abd soft, large pannus, +BS  Basename 05/11/12 0523 05/10/12 0520  HGB 8.4* 9.5*  HCT 25.6* 28.7*    Assessment/Plan: Status post Cesarean section. Doing well postoperatively.  Continue current care.  Surgery to see and make plan for possible cholecystectomy.    BOVARD,Mariell Nester 05/11/2012, 8:24 AM

## 2012-05-12 ENCOUNTER — Encounter (HOSPITAL_COMMUNITY): Payer: Self-pay | Admitting: *Deleted

## 2012-05-12 NOTE — Progress Notes (Signed)
POD #2 repeat c-section Doing ok, sore, no n/v Afeb, VSS Abd- soft, incision intact Continue routine care, ambulate

## 2012-05-13 ENCOUNTER — Encounter (HOSPITAL_COMMUNITY): Payer: Self-pay | Admitting: *Deleted

## 2012-05-13 NOTE — Progress Notes (Signed)
POD #3 repeat c-section Some pain, doing ok, baby stable in NICU with some respiratory issues Afeb, VSS Abd- soft, tender, incision well approximated Wants to stay today since baby in NICU, will continue routine care and d/c home tomorrow

## 2012-05-13 NOTE — Progress Notes (Signed)
3 Days Post-Op  Subjective: Pt is not on floor  Objective: Vital signs in last 24 hours: Temp:  [97.8 F (36.6 C)-98.3 F (36.8 C)] 98.3 F (36.8 C) (06/30 0533) Pulse Rate:  [73-91] 78  (06/30 0533) Resp:  [18-22] 18  (06/30 0533) BP: (97-125)/(64-80) 105/71 mmHg (06/30 0533) SpO2:  [97 %-100 %] 97 % (06/30 0533)    Intake/Output from previous day:   Intake/Output this shift:    GI: not able to examine today. pt not on floor  Lab Results:   Basename 05/11/12 0523  WBC 8.0  HGB 8.4*  HCT 25.6*  PLT 148*   BMET No results found for this basename: NA:2,K:2,CL:2,CO2:2,GLUCOSE:2,BUN:2,CREATININE:2,CALCIUM:2 in the last 72 hours PT/INR No results found for this basename: LABPROT:2,INR:2 in the last 72 hours ABG No results found for this basename: PHART:2,PCO2:2,PO2:2,HCO3:2 in the last 72 hours  Studies/Results: No results found.  Anti-infectives: Anti-infectives     Start     Dose/Rate Route Frequency Ordered Stop   05/10/12 1200   gentamicin (GARAMYCIN) 100 mg, clindamycin (CLEOCIN) 900 mg in dextrose 5 % 100 mL IVPB        200 mL/hr over 30 Minutes Intravenous On call to O.R. 05/09/12 1213 05/10/12 1310   05/10/12 0600   gentamicin (GARAMYCIN) 100 mg, clindamycin (CLEOCIN) 900 mg in dextrose 5 % 100 mL IVPB  Status:  Discontinued        200 mL/hr over 30 Minutes Intravenous On call to O.R. 05/09/12 1706 05/09/12 1713          Assessment/Plan: [name]/p Procedure([name]) (LRB): CESAREAN SECTION WITH BILATERAL TUBAL LIGATION (Bilateral) will discuss with primary team the possibility of removing gallbladder this week  LOS: 4 days    [name],Angela [name] 05/13/2012

## 2012-05-14 MED ORDER — IBUPROFEN 800 MG PO TABS: 800.0000 mg | ORAL_TABLET | Freq: Three times a day (TID) | ORAL | Status: AC

## 2012-05-14 MED ORDER — OXYCODONE-ACETAMINOPHEN 5-325 MG PO TABS: 1.0000 | ORAL_TABLET | ORAL | Status: AC | PRN

## 2012-05-14 NOTE — Progress Notes (Signed)
Pt discharged to home via public transportation [place].   Pt ambulated to main entrance with Stevphen Meuse, NT.  No equipment for home ordered at discharge.

## 2012-05-14 NOTE — Discharge Summary (Signed)
Obstetric Discharge Summary Reason for Admission: IUP at 36+ weeks, intractable pain from impacted gallstone Prenatal Procedures: none Intrapartum Procedures: cesarean: low cervical, transverse and tubal ligation Postpartum Procedures: none Complications-Operative and Postpartum: none Hemoglobin  Date Value Range Status  05/11/2012 8.4* 12.0 - 15.0 g/dL Final     HCT  Date Value Range Status  05/11/2012 25.6* 36.0 - 46.0 % Final    Physical Exam:  General: alert Lochia: appropriate Uterine Fundus: firm Incision: healing well  Discharge Diagnoses: IUP at 36 weeks, cholelithiasis  Discharge Information: Date: 05/14/2012 Activity: pelvic rest and no strenuous activity Diet: routine Medications: Ibuprofen and Percocet Condition: stable Instructions: refer to practice specific booklet Discharge to: home Follow-up Information    Follow up with BOVARD,JODY, MD. Schedule an appointment as soon as possible for a visit in 2 weeks. (Will also need f/u with Dr. Carolynne Edouard)    Contact information:   [address] [facility] [address] [place] [address] [phone]          Newborn Data: Live born female  Birth Weight: 5 lb 11.1 oz (2583 g) APGAR: 6, 9   still in NICU with respiratory issues.  Verneta Hamidi D 05/14/2012, 8:19 AM

## 2012-05-14 NOTE — Discharge Instructions (Signed)
As per discharge pamphlet °

## 2012-05-14 NOTE — Progress Notes (Signed)
POD #4 repeat c-section Sore but ok Afeb, VSS Abd- soft, incision intact D/c home today, will need to f/u with Dr. Carolynne Edouard for chole

## 2012-05-22 ENCOUNTER — Ambulatory Visit (INDEPENDENT_AMBULATORY_CARE_PROVIDER_SITE_OTHER): Payer: Medicaid Other | Admitting: General Surgery

## 2012-05-25 ENCOUNTER — Ambulatory Visit (INDEPENDENT_AMBULATORY_CARE_PROVIDER_SITE_OTHER): Payer: Medicaid Other | Admitting: General Surgery

## 2012-05-30 ENCOUNTER — Other Ambulatory Visit (HOSPITAL_COMMUNITY): Payer: Self-pay | Admitting: Obstetrics and Gynecology

## 2012-05-30 DIAGNOSIS — Z98891 History of uterine scar from previous surgery: Secondary | ICD-10-CM

## 2012-05-30 DIAGNOSIS — R109 Unspecified abdominal pain: Secondary | ICD-10-CM

## 2012-05-30 DIAGNOSIS — K8020 Calculus of gallbladder without cholecystitis without obstruction: Secondary | ICD-10-CM

## 2012-05-31 ENCOUNTER — Encounter (HOSPITAL_COMMUNITY): Payer: Self-pay

## 2012-05-31 ENCOUNTER — Ambulatory Visit (HOSPITAL_COMMUNITY)
Admission: RE | Admit: 2012-05-31 | Discharge: 2012-05-31 | Disposition: A | Discharge status: Home / Self Care | Payer: Medicaid Other | Source: Ambulatory Visit | Attending: Obstetrics and Gynecology | Admitting: Obstetrics and Gynecology

## 2012-05-31 DIAGNOSIS — R109 Unspecified abdominal pain: Secondary | ICD-10-CM

## 2012-05-31 DIAGNOSIS — K449 Diaphragmatic hernia without obstruction or gangrene: Secondary | ICD-10-CM | POA: Insufficient documentation

## 2012-05-31 DIAGNOSIS — R188 Other ascites: Secondary | ICD-10-CM | POA: Insufficient documentation

## 2012-05-31 DIAGNOSIS — K8020 Calculus of gallbladder without cholecystitis without obstruction: Secondary | ICD-10-CM | POA: Insufficient documentation

## 2012-05-31 DIAGNOSIS — Z9889 Other specified postprocedural states: Secondary | ICD-10-CM | POA: Insufficient documentation

## 2012-05-31 DIAGNOSIS — Z98891 History of uterine scar from previous surgery: Secondary | ICD-10-CM

## 2012-05-31 MED ORDER — IOHEXOL 300 MG/ML  SOLN
100.0000 mL | Freq: Once | INTRAMUSCULAR | Status: AC | PRN
  Administered 2012-05-31: 100 mL via INTRAVENOUS

## 2012-06-01 ENCOUNTER — Telehealth (INDEPENDENT_AMBULATORY_CARE_PROVIDER_SITE_OTHER): Payer: Self-pay

## 2012-06-01 ENCOUNTER — Telehealth (INDEPENDENT_AMBULATORY_CARE_PROVIDER_SITE_OTHER): Payer: Self-pay | Admitting: General Surgery

## 2012-06-01 NOTE — Telephone Encounter (Signed)
Called pt back to let her know that Dr Carolynne Edouard does not have any sooner appt and pt is ok with staying with Dr Andrey Campanile

## 2012-06-01 NOTE — Telephone Encounter (Signed)
The patient is scheduled to be seen regarding her gallbladder on August 7th by Dr Andrey Campanile.  She is requesting a sooner appointment with Dr Carolynne Edouard because he saw her in the hospital and knows her case.  Please call if can work this out.

## 2012-06-04 MED ORDER — BUPIVACAINE HCL (PF) 0.25 % IJ SOLN
INTRAMUSCULAR | Status: AC
  Filled 2012-06-04: qty 30

## 2012-06-04 MED ORDER — BUPIVACAINE-EPINEPHRINE PF 0.25-1:200000 % IJ SOLN
INTRAMUSCULAR | Status: AC
  Filled 2012-06-04: qty 30

## 2012-06-18 ENCOUNTER — Telehealth (INDEPENDENT_AMBULATORY_CARE_PROVIDER_SITE_OTHER): Payer: Self-pay | Admitting: General Surgery

## 2012-06-18 NOTE — Telephone Encounter (Signed)
Pt calling to ask if MD would give her pain medicine to bridge to surgery.  Stated that after the [facility] physician had evaluated her in the office, it would be at his discretion to do that.  She understands.

## 2012-06-20 ENCOUNTER — Ambulatory Visit (INDEPENDENT_AMBULATORY_CARE_PROVIDER_SITE_OTHER): Payer: Medicaid Other | Admitting: General Surgery

## 2012-06-20 ENCOUNTER — Encounter (INDEPENDENT_AMBULATORY_CARE_PROVIDER_SITE_OTHER): Payer: Self-pay | Admitting: General Surgery

## 2012-06-20 VITALS — BP 128/72 | HR 72 | Resp 16 | Ht 64.0 in | Wt 239.8 lb

## 2012-06-20 DIAGNOSIS — K8020 Calculus of gallbladder without cholecystitis without obstruction: Secondary | ICD-10-CM | POA: Insufficient documentation

## 2012-06-20 MED ORDER — OXYCODONE-ACETAMINOPHEN 7.5-325 MG PO TABS: 1.0000 | ORAL_TABLET | ORAL | Status: DC | PRN

## 2012-06-20 NOTE — Patient Instructions (Signed)
Please Read the handout

## 2012-06-20 NOTE — Progress Notes (Signed)
Patient ID: [name], female   DOB: 07/06/1981, 31 y.o.   MRN: [id number]  Chief Complaint  Patient presents with  . Other    eval GB    HPI [name] is a 31 y.o. female.   HPI 31-year-old African American female referred by Dr. Bovard for evaluation for cholecystectomy. She is one-month postpartum. She was admitted several times for symptomatic cholelithiasis during her most recent pregnancy. [facility] She is is still complaining of right upper quadrant pain radiating to her back side. It occurs on a daily basis. I will last for several hours at a time. It is generally associated with nausea. She describes it as a sticking sensation. She will occasionally have some emesis. She denies any fever, chills, diarrhea, constipation, acholic stools, melena or hematochezia. She reports having a bowel movement every other day. She denies any NSAID use. She went to the ER in mid-July because of the pain and underwent CT scan. The Percocet that she was given is not controlling her pain. Past Medical History  Diagnosis Date  . Obesity   . Obesity   . GERD (gastroesophageal reflux disease)   . Gallstone (impacted) 05/09/2012  . Normal pregnancy, repeat 05/09/2012  . S/P cesarean section 05/10/2012    Past Surgical History  Procedure Date  . Cesarean section [id number]  . Cesarean section 05/10/2012    Procedure: CESAREAN SECTION;  Surgeon: Jody Bovard, MD;  Location: [facility];  Service: Gynecology;  Laterality: N/A;  . Cesarean section [id number]  . Cesarean section 110911    Family History  Problem Relation Age of Onset  . Anesthesia problems Neg Hx   . Hypotension Neg Hx   . Malignant hyperthermia Neg Hx   . Pseudochol deficiency Neg Hx   . Asthma Mother   . COPD Mother   . Arthritis Mother   . Diabetes Mother   . Hypertension Mother   . Stroke Maternal Aunt   . Diabetes Maternal Aunt   . Birth defects Maternal Aunt    breast  . Stroke Maternal Grandmother   . Diabetes Maternal Grandmother   . Stroke Paternal Grandmother   . Birth defects Paternal Uncle     colon    Social History History  Substance Use Topics  . Smoking status: Never Smoker   . Smokeless tobacco: Never Used  . Alcohol Use: No    Allergies  Allergen Reactions  . Amoxicillin Hives  . Penicillins Hives    Current Outpatient Prescriptions  Medication Sig Dispense Refill  . Prenatal Vit-Fe Fumarate-FA (PRENATAL MULTIVITAMIN) TABS Take 1 tablet by mouth daily.       No current facility-administered medications for this visit.   Facility-Administered Medications Ordered in Other Visits  Medication Dose Route Frequency Provider Last Rate Last Dose  . lactated ringers infusion   Intravenous Continuous Jody Bovard, MD        Review of Systems Review of Systems  Constitutional: Negative for fever, chills and unexpected weight change.  HENT: Negative for hearing loss, congestion, sore throat, trouble swallowing and voice change.   Eyes: Negative for visual disturbance.  Respiratory: Negative for cough, chest tightness, shortness of breath and wheezing.   Cardiovascular: Positive for leg swelling (ankle). Negative for chest pain and palpitations.  Gastrointestinal: Positive for nausea and abdominal pain. Negative for diarrhea, constipation, blood in stool, abdominal distention and anal bleeding.  Genitourinary: Negative for hematuria, vaginal bleeding and difficulty urinating.    Musculoskeletal: Negative for arthralgias.  Skin: Negative for rash and wound.  Neurological: Negative for dizziness, seizures, syncope and headaches.  Hematological: Negative for adenopathy. Does not bruise/bleed easily.  Psychiatric/Behavioral: Negative for confusion.    Blood pressure 128/72, pulse 72, resp. rate 16, height 5' 4" (1.626 m), weight 239 lb 12.8 oz (108.773 kg), last menstrual period 06/18/2012, not currently breastfeeding.  Physical  Exam Physical Exam  Constitutional: She is oriented to person, place, and time. She appears well-developed and well-nourished. No distress.       obese  HENT:  Head: Normocephalic and atraumatic.  Right Ear: External ear normal.  Left Ear: External ear normal.  Eyes: Conjunctivae are normal. No scleral icterus.  Neck: Normal range of motion. Neck supple. No tracheal deviation present. No thyromegaly present.  Cardiovascular: Normal rate, regular rhythm and normal heart sounds.   No murmur heard. Pulmonary/Chest: Effort normal. No respiratory distress. She has no wheezes.  Abdominal: Soft. Bowel sounds are normal. She exhibits no distension. There is no tenderness. There is no rebound.         Healing mini-midline incision  Musculoskeletal: She exhibits no edema and no tenderness.  Neurological: She is alert and oriented to person, place, and time.  Skin: Skin is warm and dry. No rash noted. She is not diaphoretic. No erythema.  Psychiatric: She has a normal mood and affect. Her behavior is normal. Judgment and thought content normal.    Data Reviewed Dr newman & Toth's consult notes Labs from 04/2012 - nml cmet except for albumin 2.6; hgb 8.4, hct 25 CT abd pelvis 05/2012 Abd u/s  Assessment    Symptomatic cholelithiasis    Plan    I believe the patient's symptoms are consistent with gallbladder disease.  We discussed gallbladder disease. The patient was given educational material. We discussed non-operative and operative management. We discussed the signs & symptoms of acute cholecystitis  I discussed laparoscopic cholecystectomy with IOC in detail.  The patient was given educational material as well as diagrams detailing the procedure.  We discussed the risks and benefits of a laparoscopic cholecystectomy including, but not limited to bleeding, infection, injury to surrounding structures such as the intestine or liver, bile leak, retained gallstones, need to convert to an open  procedure, prolonged diarrhea, blood clots such as  DVT, common bile duct injury, anesthesia risks, and possible need for additional procedures.  We discussed the typical post-operative recovery course. I explained that the likelihood of improvement of their symptoms is good. I explained that she is at slightly higher risk to perform her hernia in her midline due to her multiple prior C-sections and her current body weight. I gave her a prescription for Percocet. I advised her she should not breast feed while taking the Percocet. I explained that because of her recent C-section in her midline, I would plan to go into her upper abdomen on the right side  She has elected to proceed to surgery. We will schedule her for laparoscopic cholecystectomy as soon as possible  Brinkley Peet M. Hutch Rhett, MD, FACS General, Bariatric, & Minimally Invasive Surgery [facility]         Rowin Bayron M 06/20/2012, 9:33 AM    

## 2012-06-22 ENCOUNTER — Ambulatory Visit (HOSPITAL_COMMUNITY): Admission: RE | Admit: 2012-06-22 | Payer: Medicaid Other | Source: Ambulatory Visit | Admitting: General Surgery

## 2012-06-22 ENCOUNTER — Encounter (HOSPITAL_COMMUNITY): Admission: RE | Payer: Self-pay | Source: Ambulatory Visit

## 2012-06-22 SURGERY — LAPAROSCOPIC CHOLECYSTECTOMY
Anesthesia: General

## 2012-06-25 ENCOUNTER — Encounter (HOSPITAL_COMMUNITY): Payer: Self-pay | Admitting: Pharmacy Technician

## 2012-06-26 ENCOUNTER — Inpatient Hospital Stay (HOSPITAL_COMMUNITY): Admission: RE | Admit: 2012-06-26 | Payer: Medicaid Other | Source: Ambulatory Visit

## 2012-06-27 ENCOUNTER — Encounter (HOSPITAL_COMMUNITY): Payer: Self-pay

## 2012-06-27 ENCOUNTER — Encounter (HOSPITAL_COMMUNITY)
Admission: RE | Admit: 2012-06-27 | Discharge: 2012-06-27 | Disposition: A | Discharge status: Home / Self Care | Payer: Medicaid Other | Source: Ambulatory Visit | Attending: General Surgery | Admitting: General Surgery

## 2012-06-27 LAB — COMPREHENSIVE METABOLIC PANEL
AST: 18 U/L (ref 0–37)
Albumin: 3.3 g/dL — ABNORMAL LOW (ref 3.5–5.2)
BUN: 13 mg/dL (ref 6–23)
Calcium: 8.8 mg/dL (ref 8.4–10.5)
Chloride: 106 mEq/L (ref 96–112)
Creatinine, Ser: 0.79 mg/dL (ref 0.50–1.10)
Total Bilirubin: 0.3 mg/dL (ref 0.3–1.2)

## 2012-06-27 LAB — CBC WITH DIFFERENTIAL/PLATELET
Basophils Absolute: 0 10*3/uL (ref 0.0–0.1)
Basophils Relative: 1 % (ref 0–1)
Eosinophils Absolute: 0.3 10*3/uL (ref 0.0–0.7)
Eosinophils Relative: 6 % — ABNORMAL HIGH (ref 0–5)
HCT: 30.4 % — ABNORMAL LOW (ref 36.0–46.0)
Hemoglobin: 9.5 g/dL — ABNORMAL LOW (ref 12.0–15.0)
MCH: 27.4 pg (ref 26.0–34.0)
MCHC: 31.3 g/dL (ref 30.0–36.0)
MCV: 87.6 fL (ref 78.0–100.0)
Monocytes Absolute: 0.4 10*3/uL (ref 0.1–1.0)
Monocytes Relative: 7 % (ref 3–12)
Neutro Abs: 3.6 10*3/uL (ref 1.7–7.7)
RDW: 13.7 % (ref 11.5–15.5)

## 2012-06-27 LAB — SURGICAL PCR SCREEN
MRSA, PCR: NEGATIVE
Staphylococcus aureus: NEGATIVE

## 2012-06-27 LAB — HCG, SERUM, QUALITATIVE: Preg, Serum: NEGATIVE

## 2012-06-27 NOTE — Patient Instructions (Signed)
20 GWENDA HEINER  06/27/2012   Your procedure is scheduled on:  07/04/12 Wednesday    Surgery [id number]  Report to [facility] at  1045     AM.  Call this number if you have problems the morning of surgery: [phone]     Or PST   [id number]  [facility]   Remember:   Do not eat food: or drink any fluids After Midnight. Tuesday NIGHT      Take these medicines the morning of surgery with A SIP OF WATER:   PERCOCET IF NEEDED   Do not wear jewelry, make-up or nail polish.  Do not wear lotions, powders, or perfumes. You may wear deodorant.  Do not shave 48 hours prior to surgery.  Do not bring valuables to the hospital.  Contacts, dentures or bridgework may not be worn into surgery.  Leave suitcase in the car. After surgery it may be brought to your room.  For patients admitted to the hospital, checkout time is 11:00 AM the day of discharge.   Patients discharged the day of surgery will not be allowed to drive home.  Name and phone number of your driver: boyfriend                                                                     Special Instructions: CHG Shower Use Special Wash: 1/2 bottle night before surgery and 1/2 bottle morning of surgery. REGULAR SOAP FACE AND PRIVATES              LADIES- NO SHAVING 48 HOURS BEFORE USING BETASEPT SOAP.                  Please read over the following fact sheets that you were given: MRSA Information

## 2012-06-27 NOTE — Progress Notes (Signed)
DR Andrey Campanile--- PLEASE REVIEW ABNORMAL CBC- THANKS

## 2012-07-04 ENCOUNTER — Encounter (HOSPITAL_COMMUNITY): Payer: Self-pay | Admitting: Anesthesiology

## 2012-07-04 ENCOUNTER — Encounter (HOSPITAL_COMMUNITY)
Admission: RE | Disposition: A | Discharge status: Home / Self Care | Payer: Self-pay | Source: Ambulatory Visit | Attending: General Surgery

## 2012-07-04 ENCOUNTER — Ambulatory Visit (HOSPITAL_COMMUNITY)
Admission: RE | Admit: 2012-07-04 | Discharge: 2012-07-05 | Disposition: A | Discharge status: Home / Self Care | Payer: Medicaid Other | Source: Ambulatory Visit | Attending: General Surgery | Admitting: General Surgery

## 2012-07-04 ENCOUNTER — Encounter (HOSPITAL_COMMUNITY): Payer: Self-pay | Admitting: *Deleted

## 2012-07-04 ENCOUNTER — Ambulatory Visit (HOSPITAL_COMMUNITY): Payer: Medicaid Other | Admitting: Anesthesiology

## 2012-07-04 DIAGNOSIS — K8020 Calculus of gallbladder without cholecystitis without obstruction: Secondary | ICD-10-CM | POA: Diagnosis present

## 2012-07-04 DIAGNOSIS — K8010 Calculus of gallbladder with chronic cholecystitis without obstruction: Secondary | ICD-10-CM

## 2012-07-04 DIAGNOSIS — Z01812 Encounter for preprocedural laboratory examination: Secondary | ICD-10-CM | POA: Insufficient documentation

## 2012-07-04 DIAGNOSIS — K219 Gastro-esophageal reflux disease without esophagitis: Secondary | ICD-10-CM | POA: Insufficient documentation

## 2012-07-04 HISTORY — PX: CHOLECYSTECTOMY: SHX55

## 2012-07-04 SURGERY — LAPAROSCOPIC CHOLECYSTECTOMY
Anesthesia: General | Site: Abdomen | Wound class: Clean Contaminated

## 2012-07-04 MED ORDER — MIDAZOLAM HCL 5 MG/5ML IJ SOLN
INTRAMUSCULAR | Status: DC | PRN
  Administered 2012-07-04: 2 mg via INTRAVENOUS

## 2012-07-04 MED ORDER — CIPROFLOXACIN IN D5W 400 MG/200ML IV SOLN
INTRAVENOUS | Status: AC
  Filled 2012-07-04: qty 200

## 2012-07-04 MED ORDER — KETOROLAC TROMETHAMINE 30 MG/ML IJ SOLN: 30.0000 mg | Freq: Three times a day (TID) | INTRAMUSCULAR | Status: DC | PRN

## 2012-07-04 MED ORDER — ENOXAPARIN SODIUM 40 MG/0.4ML ~~LOC~~ SOLN: 40.0000 mg | SUBCUTANEOUS | Status: DC

## 2012-07-04 MED ORDER — KETAMINE HCL 10 MG/ML IJ SOLN
INTRAMUSCULAR | Status: DC | PRN
  Administered 2012-07-04: 25 mg via INTRAVENOUS
  Administered 2012-07-04 (×4): 2 mg via INTRAVENOUS

## 2012-07-04 MED ORDER — PROPOFOL 10 MG/ML IV EMUL
INTRAVENOUS | Status: DC | PRN
  Administered 2012-07-04: 150 mg via INTRAVENOUS

## 2012-07-04 MED ORDER — HYDROMORPHONE HCL PF 1 MG/ML IJ SOLN
INTRAMUSCULAR | Status: AC
  Filled 2012-07-04: qty 1

## 2012-07-04 MED ORDER — ENOXAPARIN SODIUM 40 MG/0.4ML ~~LOC~~ SOLN
40.0000 mg | SUBCUTANEOUS | Status: DC
  Filled 2012-07-04: qty 0.4

## 2012-07-04 MED ORDER — ONDANSETRON HCL 4 MG/2ML IJ SOLN
INTRAMUSCULAR | Status: DC | PRN
  Administered 2012-07-04: 4 mg via INTRAVENOUS

## 2012-07-04 MED ORDER — ACETAMINOPHEN 10 MG/ML IV SOLN
INTRAVENOUS | Status: DC | PRN
  Administered 2012-07-04: 1000 mg via INTRAVENOUS

## 2012-07-04 MED ORDER — GLYCOPYRROLATE 0.2 MG/ML IJ SOLN
INTRAMUSCULAR | Status: DC | PRN
  Administered 2012-07-04: 0.6 mg via INTRAVENOUS

## 2012-07-04 MED ORDER — MORPHINE SULFATE 2 MG/ML IJ SOLN
1.0000 mg | INTRAMUSCULAR | Status: DC | PRN
  Administered 2012-07-04 (×2): 2 mg via INTRAVENOUS
  Filled 2012-07-04 (×2): qty 1

## 2012-07-04 MED ORDER — LACTATED RINGERS IV SOLN
INTRAVENOUS | Status: DC
  Administered 2012-07-04: 1000 mL via INTRAVENOUS
  Administered 2012-07-04: 14:00:00 via INTRAVENOUS

## 2012-07-04 MED ORDER — NEOSTIGMINE METHYLSULFATE 1 MG/ML IJ SOLN
INTRAMUSCULAR | Status: DC | PRN
  Administered 2012-07-04: 4 mg via INTRAVENOUS

## 2012-07-04 MED ORDER — ACETAMINOPHEN 10 MG/ML IV SOLN
INTRAVENOUS | Status: AC
  Filled 2012-07-04: qty 100

## 2012-07-04 MED ORDER — OXYCODONE-ACETAMINOPHEN 5-325 MG PO TABS
1.0000 | ORAL_TABLET | ORAL | Status: DC | PRN
  Administered 2012-07-05: 1 via ORAL
  Filled 2012-07-04: qty 1

## 2012-07-04 MED ORDER — DEXAMETHASONE SODIUM PHOSPHATE 4 MG/ML IJ SOLN
INTRAMUSCULAR | Status: DC | PRN
  Administered 2012-07-04: 10 mg via INTRAVENOUS

## 2012-07-04 MED ORDER — ROCURONIUM BROMIDE 100 MG/10ML IV SOLN
INTRAVENOUS | Status: DC | PRN
  Administered 2012-07-04: 10 mg via INTRAVENOUS
  Administered 2012-07-04: 30 mg via INTRAVENOUS

## 2012-07-04 MED ORDER — FENTANYL CITRATE 0.05 MG/ML IJ SOLN
INTRAMUSCULAR | Status: DC | PRN
  Administered 2012-07-04: 100 ug via INTRAVENOUS
  Administered 2012-07-04: 50 ug via INTRAVENOUS

## 2012-07-04 MED ORDER — CIPROFLOXACIN IN D5W 400 MG/200ML IV SOLN
400.0000 mg | INTRAVENOUS | Status: AC
  Administered 2012-07-04: 400 mg via INTRAVENOUS

## 2012-07-04 MED ORDER — ONDANSETRON HCL 4 MG/2ML IJ SOLN: 4.0000 mg | Freq: Four times a day (QID) | INTRAMUSCULAR | Status: DC | PRN

## 2012-07-04 MED ORDER — BUPIVACAINE-EPINEPHRINE PF 0.25-1:200000 % IJ SOLN
INTRAMUSCULAR | Status: AC
  Filled 2012-07-04: qty 30

## 2012-07-04 MED ORDER — ONDANSETRON HCL 4 MG PO TABS: 4.0000 mg | ORAL_TABLET | Freq: Four times a day (QID) | ORAL | Status: DC | PRN

## 2012-07-04 MED ORDER — KCL IN DEXTROSE-NACL 20-5-0.45 MEQ/L-%-% IV SOLN
INTRAVENOUS | Status: DC
  Administered 2012-07-04 – 2012-07-05 (×2): via INTRAVENOUS
  Filled 2012-07-04 (×3): qty 1000

## 2012-07-04 MED ORDER — HYDROMORPHONE HCL PF 1 MG/ML IJ SOLN
0.2500 mg | INTRAMUSCULAR | Status: DC | PRN
  Administered 2012-07-04 (×2): 0.5 mg via INTRAVENOUS

## 2012-07-04 MED ORDER — LACTATED RINGERS IV SOLN
INTRAVENOUS | Status: DC | PRN
  Administered 2012-07-04: 1000 mL

## 2012-07-04 MED ORDER — PROMETHAZINE HCL 25 MG/ML IJ SOLN: 6.2500 mg | INTRAMUSCULAR | Status: DC | PRN

## 2012-07-04 MED ORDER — METOCLOPRAMIDE HCL 5 MG/ML IJ SOLN
INTRAMUSCULAR | Status: DC | PRN
  Administered 2012-07-04: 10 mg via INTRAVENOUS

## 2012-07-04 MED ORDER — BUPIVACAINE-EPINEPHRINE 0.25% -1:200000 IJ SOLN
INTRAMUSCULAR | Status: DC | PRN
  Administered 2012-07-04: 20 mL

## 2012-07-04 SURGICAL SUPPLY — 40 items
APPLIER CLIP 5 13 M/L LIGAMAX5 (MISCELLANEOUS)
APPLIER CLIP ROT 10 11.4 M/L (STAPLE)
BANDAGE ADHESIVE 1X3 (GAUZE/BANDAGES/DRESSINGS) ×9 IMPLANT
BENZOIN TINCTURE PRP APPL 2/3 (GAUZE/BANDAGES/DRESSINGS) ×3 IMPLANT
CANISTER SUCTION 2500CC (MISCELLANEOUS) ×3 IMPLANT
CHLORAPREP W/TINT 26ML (MISCELLANEOUS) ×3 IMPLANT
CLIP APPLIE 5 13 M/L LIGAMAX5 (MISCELLANEOUS) IMPLANT
CLIP APPLIE ROT 10 11.4 M/L (STAPLE) IMPLANT
CLOTH BEACON ORANGE TIMEOUT ST (SAFETY) ×3 IMPLANT
COVER MAYO STAND STRL (DRAPES) IMPLANT
COVER SURGICAL LIGHT HANDLE (MISCELLANEOUS) ×3 IMPLANT
DECANTER SPIKE VIAL GLASS SM (MISCELLANEOUS) ×3 IMPLANT
DEVICE TROCAR PUNCTURE CLOSURE (ENDOMECHANICALS) ×3 IMPLANT
DRAPE C-ARM 42X72 X-RAY (DRAPES) IMPLANT
DRAPE LAPAROSCOPIC ABDOMINAL (DRAPES) ×3 IMPLANT
DRAPE UTILITY XL STRL (DRAPES) ×3 IMPLANT
DRSG TEGADERM 2-3/8X2-3/4 SM (GAUZE/BANDAGES/DRESSINGS) ×3 IMPLANT
ELECT REM PT RETURN 9FT ADLT (ELECTROSURGICAL) ×3
ELECTRODE REM PT RTRN 9FT ADLT (ELECTROSURGICAL) ×2 IMPLANT
GLOVE BIO SURGEON STRL SZ7.5 (GLOVE) ×3 IMPLANT
GLOVE BIOGEL M STRL SZ7.5 (GLOVE) IMPLANT
GLOVE BIOGEL PI IND STRL 7.0 (GLOVE) ×2 IMPLANT
GLOVE BIOGEL PI INDICATOR 7.0 (GLOVE) ×1
GLOVE INDICATOR 8.0 STRL GRN (GLOVE) ×3 IMPLANT
GOWN STRL NON-REIN LRG LVL3 (GOWN DISPOSABLE) ×3 IMPLANT
GOWN STRL REIN XL XLG (GOWN DISPOSABLE) ×6 IMPLANT
KIT BASIN OR (CUSTOM PROCEDURE TRAY) ×3 IMPLANT
NS IRRIG 1000ML POUR BTL (IV SOLUTION) ×3 IMPLANT
POUCH SPECIMEN RETRIEVAL 10MM (ENDOMECHANICALS) ×3 IMPLANT
SET CHOLANGIOGRAPH MIX (MISCELLANEOUS) IMPLANT
SET IRRIG TUBING LAPAROSCOPIC (IRRIGATION / IRRIGATOR) ×3 IMPLANT
SOLUTION ANTI FOG 6CC (MISCELLANEOUS) ×3 IMPLANT
STRIP CLOSURE SKIN 1/2X4 (GAUZE/BANDAGES/DRESSINGS) ×3 IMPLANT
SUT MNCRL AB 4-0 PS2 18 (SUTURE) ×3 IMPLANT
TOWEL OR 17X26 10 PK STRL BLUE (TOWEL DISPOSABLE) ×3 IMPLANT
TRAY LAP CHOLE (CUSTOM PROCEDURE TRAY) ×3 IMPLANT
TROCAR BLADELESS OPT 5 75 (ENDOMECHANICALS) ×6 IMPLANT
TROCAR XCEL BLUNT TIP 100MML (ENDOMECHANICALS) ×3 IMPLANT
TROCAR XCEL NON-BLD 11X100MML (ENDOMECHANICALS) IMPLANT
TUBING INSUFFLATION 10FT LAP (TUBING) ×3 IMPLANT

## 2012-07-04 NOTE — Anesthesia Procedure Notes (Signed)
Procedure Name: Intubation Date/Time: 07/04/2012 1:03 PM Performed by: Randon Goldsmith CATHERINE PAYNE Pre-anesthesia Checklist: Patient identified, Emergency Drugs available, Suction available and Patient being monitored Patient Re-evaluated:Patient Re-evaluated prior to inductionOxygen Delivery Method: Circle system utilized Preoxygenation: Pre-oxygenation with 100% oxygen Intubation Type: IV induction Ventilation: Mask ventilation without difficulty Laryngoscope Size: Mac and 3 Grade View: Grade I Tube type: Oral Tube size: 7.5 mm Number of attempts: 1 Airway Equipment and Method: Stylet Secured at: 21 cm Tube secured with: Tape Dental Injury: Teeth and Oropharynx as per pre-operative assessment

## 2012-07-04 NOTE — Anesthesia Postprocedure Evaluation (Signed)
  Anesthesia Post-op Note  Patient: [name]  Procedure(s) Performed: Procedure(s) (LRB): LAPAROSCOPIC CHOLECYSTECTOMY (N/A) LAPAROSCOPIC LYSIS OF ADHESIONS ()  Patient Location: PACU  Anesthesia Type: General  Level of Consciousness: awake and alert   Airway and Oxygen Therapy: Patient Spontanous Breathing  Post-op Pain: mild  Post-op Assessment: Post-op Vital signs reviewed, Patient's Cardiovascular Status Stable, Respiratory Function Stable, Patent Airway and No signs of Nausea or vomiting  Post-op Vital Signs: stable  Complications: No apparent anesthesia complications

## 2012-07-04 NOTE — Preoperative (Signed)
Beta Blockers   Reason not to administer Beta Blockers:Not Applicable, not on home BB 

## 2012-07-04 NOTE — Interval H&P Note (Signed)
History and Physical Interval Note:  07/04/2012 12:42 PM  [name]  has presented today for surgery, with the diagnosis of symptomatic cholelithiasis  The various methods of treatment have been discussed with the patient and family. After consideration of risks, benefits and other options for treatment, the patient has consented to  Procedure(s) (LRB): LAPAROSCOPIC CHOLECYSTECTOMY (N/A) as a surgical intervention .  The patient's history has been reviewed, patient examined, no change in status, stable for surgery.  I have reviewed the patient's chart and labs.  Questions were answered to the patient's satisfaction.    Mary Sella. Andrey Campanile, MD, FACS General, Bariatric, & Minimally Invasive Surgery [facility], [place]  [facility] M

## 2012-07-04 NOTE — Op Note (Signed)
Laparoscopic Cholecystectomy & Laparoscopic Lysis of Adhesions Procedure Note  Indications: This patient presents with symptomatic gallbladder disease and will undergo laparoscopic cholecystectomy.  Pre-operative Diagnosis: Calculus of gallbladder without mention of cholecystitis or obstruction  Post-operative Diagnosis: Same; omental adhesions  Surgeon: Atilano Ina   Assistants: none  Anesthesia: General endotracheal anesthesia  ASA Class: 3  Procedure Details  The patient was seen again in the Holding Room. The risks, benefits, complications, treatment options, and expected outcomes were discussed with the patient. The possibilities of reaction to medication, pulmonary aspiration, perforation of viscus, bleeding, recurrent infection, finding a normal gallbladder, the need for additional procedures, failure to diagnose a condition, the possible need to convert to an open procedure, and creating a complication requiring transfusion or operation were discussed with the patient. The likelihood of improving the patient's symptoms with return to their baseline status is good.  The patient and/or family concurred with the proposed plan, giving informed consent. The site of surgery properly noted. The patient was taken to Operating Room, identified as Aris Everts and the procedure verified as Laparoscopic Cholecystectomy with Intraoperative Cholangiogram. A Time Out was held and the above information confirmed.  Prior to the induction of general anesthesia, antibiotic prophylaxis was administered. General endotracheal anesthesia was then administered and tolerated well. After the induction, the abdomen was prepped with Chloraprep and draped in the sterile fashion. The patient was positioned in the supine position.  Local anesthetic agent was injected into the skin in the right upper quadrant. A 1cm incision was made in the RUQ. The abdominal cavity was entered with an Optiview technique. A  0 5 mm laparoscope was advanced through the 5 mm trocar which was then advanced through all layers of the abdominal wall under direct visualization until it entered the abdominal cavity. Pneumoperitoneum was smoothly established the patient pressure of 15 mm mercury. The abdomen was entered to the right upper quadrant because the patient has had a recent C-section through a mini midline incision around her umbilicus. Upon surveilling the abdominal cavity there is omental adhesions in the midline and extending several inches above the umbilicus to several inches below the umbilicus.  Pneumoperitoneum was then created with CO2 and tolerated well without any adverse changes in the patient's vital signs. An 5-mm port was placed in the subxiphoid position.  An additional 5-mm ports were placed in the lateral right upper quadrant. An 11 mm trocar was placed supraumbilically through her previous C-section incision through the omental adhesions. All skin incisions were infiltrated with a local anesthetic agent before making the incision and placing the trocars.   We positioned the patient in reverse Trendelenburg, tilted slightly to the patient's left.  The gallbladder was identified, the fundus grasped and retracted cephalad. Adhesions were lysed bluntly and with the electrocautery where indicated, taking care not to injure any adjacent organs or viscus. The infundibulum was grasped and retracted laterally, exposing the peritoneum overlying the triangle of Calot. This was then divided and exposed in a blunt fashion. A critical view of the cystic duct and cystic artery was obtained.  The cystic duct was clearly identified and bluntly dissected circumferentially. The cystic duct was ligated with a clip distally.     The cystic duct was then ligated with clips and divided. The cystic artery was identified, dissected free, ligated with clips and divided as well.   The gallbladder was dissected from the liver bed in  retrograde fashion with the electrocautery. The gallbladder was removed and placed in  an Endocatch sac.  Hemostasis was achieved with the electrocautery. Copious irrigation was utilized and was repeatedly aspirated until clear. The gallbladder and Endocatch sac were then removed through the umbilical port site. The patient had a large gallstone. Try to enlarge the fascial defect by spreading a Kelly; however, that was not sufficient. The Endo Catch bag was opened. The gallbladder was then opened. I then extracted a gallstone using a sponge stick. This allowed me to remove the remaining portion of the gallbladder in the Endo Catch bag. There is no evidence of stone spillage. The Endo Catch bag was intact. The 11 mm trocar was then replaced. The liver bed was irrigated and inspected.  In order to close the fascia in the superior umbilical position, and then take down the omental adhesions in the midline using Endo Shears and atraumatic grasper. This was done with and without electrocautery. There is no evidence of small bowel adhered to the abdominal wall. The adhesions were just simple omental adhesions. It took about 15 minutes to lyse the adhesions. I then closed the fascial defect with a single interrupted 0 Vicryl suture using an Endo close device    We again inspected the right upper quadrant for hemostasis.  The umbilical closure was inspected and there was no air leak and nothing trapped within the closure. Pneumoperitoneum was released as we removed the trocars.  4-0 Monocryl was used to close the skin.   Dermabond was applied. The patient was then extubated and brought to the recovery room in stable condition. Instrument, sponge, and needle counts were correct at closure and at the conclusion of the case.   Findings: Probable Cholecystitis with Cholelithiasis; omental adhesions to midline  Estimated Blood Loss: Minimal         Drains: none         Specimens: Gallbladder           Complications:  None; patient tolerated the procedure well.         Disposition: PACU - hemodynamically stable.         Condition: stable  Mary Sella. Andrey Campanile, MD, FACS General, Bariatric, & Minimally Invasive Surgery [facility], [place]

## 2012-07-04 NOTE — Anesthesia Preprocedure Evaluation (Addendum)
Anesthesia Evaluation  Patient identified by MRN, date of birth, ID band Patient awake    Reviewed: Allergy & Precautions, H&P , NPO status , Patient's Chart, lab work & pertinent test results  Airway Mallampati: II TM Distance: >3 FB Neck ROM: Full    Dental No notable dental hx.    Pulmonary neg pulmonary ROS,  breath sounds clear to auscultation  Pulmonary exam normal       Cardiovascular negative cardio ROS  Rhythm:Regular Rate:Normal     Neuro/Psych negative neurological ROS  negative psych ROS   GI/Hepatic negative GI ROS, Neg liver ROS, GERD-  ,  Endo/Other  Morbid obesity  Renal/GU negative Renal ROS  negative genitourinary   Musculoskeletal negative musculoskeletal ROS (+)   Abdominal   Peds negative pediatric ROS (+)  Hematology negative hematology ROS (+)   Anesthesia Other Findings   Reproductive/Obstetrics negative OB ROS                           Anesthesia Physical Anesthesia Plan  ASA: III  Anesthesia Plan: General   Post-op Pain Management:    Induction: Intravenous  Airway Management Planned: Oral ETT  Additional Equipment:   Intra-op Plan:   Post-operative Plan: Extubation in OR  Informed Consent: I have reviewed the patients History and Physical, chart, labs and discussed the procedure including the risks, benefits and alternatives for the proposed anesthesia with the patient or authorized representative who has indicated his/her understanding and acceptance.   Dental advisory given  Plan Discussed with: CRNA  Anesthesia Plan Comments: (/Not breast feeding. Ceasarian section 05/10/12)       Anesthesia Quick Evaluation

## 2012-07-04 NOTE — Transfer of Care (Signed)
Immediate Anesthesia Transfer of Care Note  Patient: [name]  Procedure(s) Performed: Procedure(s) (LRB): LAPAROSCOPIC CHOLECYSTECTOMY (N/A) LAPAROSCOPIC LYSIS OF ADHESIONS ()  Patient Location: PACU  Anesthesia Type: General  Level of Consciousness: awake and sedated  Airway & Oxygen Therapy: Patient Spontanous Breathing and Patient connected to face mask oxygen  Post-op Assessment: Report given to PACU RN, Post -op Vital signs reviewed and stable and Patient moving all extremities  Post vital signs: Reviewed and stable  Complications: No apparent anesthesia complications

## 2012-07-04 NOTE — H&P (View-Only) (Signed)
Patient ID: [name], female   DOB: 05/14/1981, 31 y.o.   MRN: [id number]  Chief Complaint  Patient presents with  . Other    eval GB    HPI [name] is a 31 y.o. female.   HPI 31 year old [place] American female referred by Dr. Ellyn Hack for evaluation for cholecystectomy. She is one-month postpartum. She was admitted several times for symptomatic cholelithiasis during her most recent pregnancy. She was seen twice by our service while at [facility]. She is is still complaining of right upper quadrant pain radiating to her back side. It occurs on a daily basis. I will last for several hours at a time. It is generally associated with nausea. She describes it as a sticking sensation. She will occasionally have some emesis. She denies any fever, chills, diarrhea, constipation, acholic stools, melena or hematochezia. She reports having a bowel movement every other day. She denies any NSAID use. She went to the ER in mid-July because of the pain and underwent CT scan. The Percocet that she was given is not controlling her pain. Past Medical History  Diagnosis Date  . Obesity   . Obesity   . GERD (gastroesophageal reflux disease)   . Gallstone (impacted) 05/09/2012  . Normal pregnancy, repeat 05/09/2012  . S/P cesarean section 05/10/2012    Past Surgical History  Procedure Date  . Cesarean section [id number]  . Cesarean section 05/10/2012    Procedure: CESAREAN SECTION;  Surgeon: Sherron Monday, MD;  Location: [facility];  Service: Gynecology;  Laterality: N/A;  . Cesarean section [id number]  . Cesarean section [id number]    Family History  Problem Relation Age of Onset  . Anesthesia problems Neg Hx   . Hypotension Neg Hx   . Malignant hyperthermia Neg Hx   . Pseudochol deficiency Neg Hx   . Asthma Mother   . COPD Mother   . Arthritis Mother   . Diabetes Mother   . Hypertension Mother   . Stroke Maternal Aunt   . Diabetes Maternal Aunt   . Birth defects Maternal Aunt    breast  . Stroke Maternal Grandmother   . Diabetes Maternal Grandmother   . Stroke Paternal Grandmother   . Birth defects Paternal Uncle     colon    Social History History  Substance Use Topics  . Smoking status: Never Smoker   . Smokeless tobacco: Never Used  . Alcohol Use: No    Allergies  Allergen Reactions  . Amoxicillin Hives  . Penicillins Hives    Current Outpatient Prescriptions  Medication Sig Dispense Refill  . Prenatal Vit-Fe Fumarate-FA (PRENATAL MULTIVITAMIN) TABS Take 1 tablet by mouth daily.       No current facility-administered medications for this visit.   Facility-Administered Medications Ordered in Other Visits  Medication Dose Route Frequency Provider Last Rate Last Dose  . lactated ringers infusion   Intravenous Continuous Sherron Monday, MD        Review of Systems Review of Systems  Constitutional: Negative for fever, chills and unexpected weight change.  HENT: Negative for hearing loss, congestion, sore throat, trouble swallowing and voice change.   Eyes: Negative for visual disturbance.  Respiratory: Negative for cough, chest tightness, shortness of breath and wheezing.   Cardiovascular: Positive for leg swelling (ankle). Negative for chest pain and palpitations.  Gastrointestinal: Positive for nausea and abdominal pain. Negative for diarrhea, constipation, blood in stool, abdominal distention and anal bleeding.  Genitourinary: Negative for hematuria, vaginal bleeding and difficulty urinating.  Musculoskeletal: Negative for arthralgias.  Skin: Negative for rash and wound.  Neurological: Negative for dizziness, seizures, syncope and headaches.  Hematological: Negative for adenopathy. Does not bruise/bleed easily.  Psychiatric/Behavioral: Negative for confusion.    Blood pressure 128/72, pulse 72, resp. rate 16, height 5\' 4"  (1.626 m), weight 239 lb 12.8 oz (108.773 kg), last menstrual period 06/18/2012, not currently breastfeeding.  Physical  Exam Physical Exam  Constitutional: She is oriented to person, place, and time. She appears well-developed and well-nourished. No distress.       obese  HENT:  Head: Normocephalic and atraumatic.  Right Ear: External ear normal.  Left Ear: External ear normal.  Eyes: Conjunctivae are normal. No scleral icterus.  Neck: Normal range of motion. Neck supple. No tracheal deviation present. No thyromegaly present.  Cardiovascular: Normal rate, regular rhythm and normal heart sounds.   No murmur heard. Pulmonary/Chest: Effort normal. No respiratory distress. She has no wheezes.  Abdominal: Soft. Bowel sounds are normal. She exhibits no distension. There is no tenderness. There is no rebound.         Healing mini-midline incision  Musculoskeletal: She exhibits no edema and no tenderness.  Neurological: She is alert and oriented to person, place, and time.  Skin: Skin is warm and dry. No rash noted. She is not diaphoretic. No erythema.  Psychiatric: She has a normal mood and affect. Her behavior is normal. Judgment and thought content normal.    Data Reviewed Dr [facility] consult notes Labs from 04/2012 - nml cmet except for albumin 2.6; hgb 8.4, hct 25 CT abd pelvis 05/2012 Abd u/s  Assessment    Symptomatic cholelithiasis    Plan    I believe the patient's symptoms are consistent with gallbladder disease.  We discussed gallbladder disease. The patient was given Agricultural engineer. We discussed non-operative and operative management. We discussed the signs & symptoms of acute cholecystitis  I discussed laparoscopic cholecystectomy with IOC in detail.  The patient was given educational material as well as diagrams detailing the procedure.  We discussed the risks and benefits of a laparoscopic cholecystectomy including, but not limited to bleeding, infection, injury to surrounding structures such as the intestine or liver, bile leak, retained gallstones, need to convert to an open  procedure, prolonged diarrhea, blood clots such as  DVT, common bile duct injury, anesthesia risks, and possible need for additional procedures.  We discussed the typical post-operative recovery course. I explained that the likelihood of improvement of their symptoms is good. I explained that she is at slightly higher risk to perform her hernia in her midline due to her multiple prior C-sections and her current body weight. I gave her a prescription for Percocet. I advised her she should not breast feed while taking the Percocet. I explained that because of her recent C-section in her midline, I would plan to go into her upper abdomen on the right side  She has elected to proceed to surgery. We will schedule her for laparoscopic cholecystectomy as soon as possible  Mary Sella. Andrey Campanile, MD, FACS General, Bariatric, & Minimally Invasive Surgery [facility], [place]         [facility] M 06/20/2012, 9:33 AM

## 2012-07-05 ENCOUNTER — Encounter (HOSPITAL_COMMUNITY): Payer: Self-pay | Admitting: General Surgery

## 2012-07-05 MED ORDER — OXYCODONE-ACETAMINOPHEN 5-325 MG PO TABS: 1.0000 | ORAL_TABLET | ORAL | Status: AC | PRN

## 2012-07-05 NOTE — Discharge Summary (Signed)
Physician Discharge Summary  Patient ID: [name] MRN: [id number] DOB/AGE: [birth year] 31 y.o.  Admit date: 07/04/2012 Discharge date: 07/05/2012  Admission Diagnoses: Symptomatic cholelithiasis Discharge Diagnoses:  Active Problems:  Symptomatic cholelithiasis status post laparoscopic cholecystectomy and laparoscopic lysis of adhesions  Discharged Condition: good  Hospital Course: 31 year old obese African American female was taken to the operating room for elective laparoscopic cholecystectomy for symptomatic cholelithiasis. During surgery she was found to have some adhesions to her anterior midline which were taken down. She was kept overnight for observation. On postoperative day 1, she was tolerating a diet, her vital signs are stable, her pain was controlled with oral medications. Her incisions are clean dry and intact. She was ambulating without assistance. She was deemed stable for discharge  Consults: None  Significant Diagnostic Studies: none  Treatments: IV hydration, analgesia: acetaminophen, Morphine and percocet and surgery: laparoscopic cholecystectomy and laparoscopic lysis of adhesions  Discharge Exam: Blood pressure 112/55, pulse 63, temperature 97.9 F (36.6 C), temperature source Oral, resp. rate 18, height 5\' 4"  (1.626 m), weight 243 lb (110.224 kg), last menstrual period 06/18/2012, SpO2 98.00%, not currently breastfeeding. Alert, nad cta b/l Regular Obese, soft, expected mild TTP, incisions c/d/i +scds, no edema  Disposition: 01-Home or Self Care  Discharge Orders    Future Appointments: Provider: Department: Dept Phone: Center:   07/25/2012 8:45 AM Atilano Ina, MD,FACS [facility] [phone] None     Future Orders Please Complete By Expires   Increase activity slowly        Medication List  As of 07/05/2012  1:53 PM   STOP taking these medications         oxyCODONE-acetaminophen 7.5-325 MG per tablet         TAKE these  medications         ibuprofen 200 MG tablet   Commonly known as: ADVIL,MOTRIN   Take 800 mg by mouth every 6 (six) hours as needed.      oxyCODONE-acetaminophen 5-325 MG per tablet   Commonly known as: PERCOCET/ROXICET   Take 1-2 tablets by mouth every 4 (four) hours as needed.      prenatal multivitamin Tabs   Take 1 tablet by mouth daily.           Follow-up Information    Follow up with Atilano Ina, MD,FACS on 07/25/2012. (8:45 AM)    Contact information:   [address] [place] [address] [phone]         Mary Sella. Andrey Campanile, MD, FACS General, Bariatric, & Minimally Invasive Surgery [facility], [place]   Signed: Atilano Ina 07/05/2012, 1:53 PM

## 2012-07-12 ENCOUNTER — Telehealth (INDEPENDENT_AMBULATORY_CARE_PROVIDER_SITE_OTHER): Payer: Self-pay

## 2012-07-12 ENCOUNTER — Telehealth (INDEPENDENT_AMBULATORY_CARE_PROVIDER_SITE_OTHER): Payer: Self-pay | Admitting: General Surgery

## 2012-07-12 DIAGNOSIS — R109 Unspecified abdominal pain: Secondary | ICD-10-CM

## 2012-07-12 NOTE — Telephone Encounter (Signed)
Patient called back from this morning stating she is still in pain after adding ibuprofen in between her percocet. She has pain in her back and at incision sight. She wants something else prescribed being she "can't deal with the pain anymore". Patient has no other symptoms, just pain. Return number [phone]. Please advise.Marland Kitchen

## 2012-07-12 NOTE — Telephone Encounter (Signed)
Pt called to discuss pain control.  She is having low back pain and is S/P GB surgery last week.  Suggested taking 2 Percocet, or 1 1/2, Q 6 H, with Ibuprofen in-between the doses.  Advised her to try to stop sleeping in her recliner, to help with back discomfort.  She will try and will call back prn.

## 2012-07-12 NOTE — Telephone Encounter (Signed)
As long as pt has no other symptoms (fever, n/v/abd pain), try taking 2 percocet every 4 hrs as needed. Please order CMET and lipase. Can offer urgent office for tomorrow as well

## 2012-07-12 NOTE — Telephone Encounter (Signed)
Spoke with patient. She states she is having headaches and pain in her abdomen. Did not mention back pain in this conversation. She has been taking two percocet every four hours and ibuprofen in between. I advised her to go to [facility] lab to have lab work done tomorrow. Labs ordered and patient aware. We will call her with results.

## 2012-07-12 NOTE — Addendum Note (Signed)
Addended byLiliana Cline on: 07/12/2012 05:47 PM   Modules accepted: Orders

## 2012-07-24 MED ORDER — HEPARIN SODIUM (PORCINE) 1000 UNIT/ML IJ SOLN
INTRAMUSCULAR | Status: AC
  Filled 2012-07-24: qty 1

## 2012-07-24 MED ORDER — LIDOCAINE HCL (CARDIAC) 20 MG/ML IV SOLN
INTRAVENOUS | Status: AC
  Filled 2012-07-24: qty 5

## 2012-07-24 MED ORDER — SODIUM BICARBONATE 8.4 % IV SOLN
INTRAVENOUS | Status: AC
  Filled 2012-07-24: qty 100

## 2012-07-25 ENCOUNTER — Encounter (INDEPENDENT_AMBULATORY_CARE_PROVIDER_SITE_OTHER): Payer: Medicaid Other | Admitting: General Surgery

## 2012-08-08 ENCOUNTER — Encounter (INDEPENDENT_AMBULATORY_CARE_PROVIDER_SITE_OTHER): Payer: Medicaid Other | Admitting: General Surgery

## 2012-08-08 MED ORDER — FLEET ENEMA 7-19 GM/118ML RE ENEM
ENEMA | RECTAL | Status: AC
  Filled 2012-08-08: qty 1

## 2012-08-14 ENCOUNTER — Encounter (INDEPENDENT_AMBULATORY_CARE_PROVIDER_SITE_OTHER): Payer: Self-pay | Admitting: General Surgery

## 2012-09-06 MED ORDER — LIDOCAINE HCL 2 % EX GEL
CUTANEOUS | Status: AC
  Filled 2012-09-06: qty 20

## 2012-09-09 ENCOUNTER — Encounter (HOSPITAL_COMMUNITY): Payer: Self-pay | Admitting: *Deleted

## 2012-09-09 ENCOUNTER — Emergency Department (HOSPITAL_COMMUNITY)
Admission: EM | Admit: 2012-09-09 | Discharge: 2012-09-09 | Disposition: A | Discharge status: Home / Self Care | Payer: Medicaid Other | Attending: Emergency Medicine | Admitting: Emergency Medicine

## 2012-09-09 DIAGNOSIS — S51009A Unspecified open wound of unspecified elbow, initial encounter: Secondary | ICD-10-CM | POA: Insufficient documentation

## 2012-09-09 DIAGNOSIS — Z823 Family history of stroke: Secondary | ICD-10-CM | POA: Insufficient documentation

## 2012-09-09 DIAGNOSIS — IMO0002 Reserved for concepts with insufficient information to code with codable children: Secondary | ICD-10-CM

## 2012-09-09 DIAGNOSIS — E669 Obesity, unspecified: Secondary | ICD-10-CM | POA: Insufficient documentation

## 2012-09-09 DIAGNOSIS — Z833 Family history of diabetes mellitus: Secondary | ICD-10-CM | POA: Insufficient documentation

## 2012-09-09 DIAGNOSIS — K219 Gastro-esophageal reflux disease without esophagitis: Secondary | ICD-10-CM | POA: Insufficient documentation

## 2012-09-09 NOTE — ED Provider Notes (Signed)
Medical screening examination/treatment/procedure(s) were performed by non-physician practitioner and as supervising physician I was immediately available for consultation/collaboration.   Gresia Isidoro, MD 09/09/12 1549 

## 2012-09-09 NOTE — ED Notes (Signed)
Opened chart to follow up on discharge due to no way home
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
## 2012-09-09 NOTE — ED Notes (Signed)
PA at bedside.
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
## 2012-11-03 MED ORDER — BACITRACIN-NEOMYCIN-POLYMYXIN 400-5-5000 EX OINT
TOPICAL_OINTMENT | CUTANEOUS | Status: AC
  Filled 2012-11-03: qty 1

## 2012-11-19 MED ORDER — CIPROFLOXACIN IN D5W 400 MG/200ML IV SOLN
INTRAVENOUS | Status: AC
  Filled 2012-11-19: qty 200

## 2012-11-20 MED ORDER — SODIUM CHLORIDE 0.9 % IJ SOLN
INTRAMUSCULAR | Status: AC
  Filled 2012-11-20: qty 3

## 2012-11-27 MED ORDER — BSS IO SOLN
INTRAOCULAR | Status: AC
  Filled 2012-11-27: qty 15

## 2012-12-07 MED ORDER — LIDOCAINE HCL 2 % EX GEL
CUTANEOUS | Status: AC
  Filled 2012-12-07: qty 40

## 2012-12-19 MED ORDER — BSS IO SOLN
INTRAOCULAR | Status: AC
  Filled 2012-12-19: qty 15

## 2013-02-21 MED ORDER — OXYMETAZOLINE HCL 0.05 % NA SOLN
NASAL | Status: AC
  Filled 2013-02-21: qty 15

## 2013-03-06 MED ORDER — ATROPINE SULFATE 0.1 MG/ML IJ SOLN
INTRAMUSCULAR | Status: AC
  Filled 2013-03-06: qty 10

## 2013-03-11 MED ORDER — ACETAMINOPHEN 325 MG PO TABS
ORAL_TABLET | ORAL | Status: AC
  Filled 2013-03-11: qty 1

## 2013-04-11 MED ORDER — KETOROLAC TROMETHAMINE 30 MG/ML IJ SOLN
INTRAMUSCULAR | Status: AC
  Filled 2013-04-11: qty 1

## 2013-04-22 MED ORDER — LIDOCAINE HCL 2 % EX GEL
CUTANEOUS | Status: AC
  Filled 2013-04-22: qty 20

## 2013-05-02 MED ORDER — DOPAMINE-DEXTROSE 3.2-5 MG/ML-% IV SOLN
INTRAVENOUS | Status: AC
  Filled 2013-05-02: qty 250

## 2013-05-19 MED ORDER — ACETAMINOPHEN 325 MG PO TABS
ORAL_TABLET | ORAL | Status: AC
  Filled 2013-05-19: qty 1

## 2013-05-28 MED ORDER — ACETAMINOPHEN 325 MG PO TABS
ORAL_TABLET | ORAL | Status: AC
  Filled 2013-05-28: qty 1

## 2013-05-30 ENCOUNTER — Encounter (HOSPITAL_COMMUNITY): Payer: Self-pay | Admitting: Nurse Practitioner

## 2013-05-30 ENCOUNTER — Emergency Department (HOSPITAL_COMMUNITY)
Admission: EM | Admit: 2013-05-30 | Discharge: 2013-05-30 | Disposition: A | Discharge status: Home / Self Care | Payer: Medicaid Other | Attending: Emergency Medicine | Admitting: Emergency Medicine

## 2013-05-30 DIAGNOSIS — L0390 Cellulitis, unspecified: Secondary | ICD-10-CM

## 2013-05-30 DIAGNOSIS — E669 Obesity, unspecified: Secondary | ICD-10-CM | POA: Insufficient documentation

## 2013-05-30 DIAGNOSIS — L0291 Cutaneous abscess, unspecified: Secondary | ICD-10-CM | POA: Insufficient documentation

## 2013-05-30 DIAGNOSIS — R6883 Chills (without fever): Secondary | ICD-10-CM | POA: Insufficient documentation

## 2013-05-30 DIAGNOSIS — IMO0001 Reserved for inherently not codable concepts without codable children: Secondary | ICD-10-CM | POA: Insufficient documentation

## 2013-05-30 DIAGNOSIS — Z880 Allergy status to penicillin: Secondary | ICD-10-CM | POA: Insufficient documentation

## 2013-05-30 DIAGNOSIS — L304 Erythema intertrigo: Secondary | ICD-10-CM

## 2013-05-30 DIAGNOSIS — Z79899 Other long term (current) drug therapy: Secondary | ICD-10-CM | POA: Insufficient documentation

## 2013-05-30 DIAGNOSIS — Z9889 Other specified postprocedural states: Secondary | ICD-10-CM | POA: Insufficient documentation

## 2013-05-30 DIAGNOSIS — Z8719 Personal history of other diseases of the digestive system: Secondary | ICD-10-CM | POA: Insufficient documentation

## 2013-05-30 DIAGNOSIS — R197 Diarrhea, unspecified: Secondary | ICD-10-CM | POA: Insufficient documentation

## 2013-05-30 DIAGNOSIS — L538 Other specified erythematous conditions: Secondary | ICD-10-CM | POA: Insufficient documentation

## 2013-05-30 MED ORDER — CLINDAMYCIN PHOSPHATE 600 MG/50ML IV SOLN
600.0000 mg | Freq: Once | INTRAVENOUS | Status: AC
  Administered 2013-05-30: 600 mg via INTRAVENOUS
  Filled 2013-05-30: qty 50

## 2013-05-30 MED ORDER — SODIUM CHLORIDE 0.9 % IV BOLUS (SEPSIS)
1000.0000 mL | Freq: Once | INTRAVENOUS | Status: AC
  Administered 2013-05-30: 1000 mL via INTRAVENOUS

## 2013-05-30 MED ORDER — SULFAMETHOXAZOLE-TRIMETHOPRIM 800-160 MG PO TABS: 1.0000 | ORAL_TABLET | Freq: Two times a day (BID) | ORAL | Status: DC

## 2013-05-30 MED ORDER — OXYCODONE-ACETAMINOPHEN 5-325 MG PO TABS: 1.0000 | ORAL_TABLET | ORAL | Status: DC | PRN

## 2013-05-30 MED ORDER — TERBINAFINE HCL 1 % EX CREA: TOPICAL_CREAM | Freq: Two times a day (BID) | CUTANEOUS | Status: DC

## 2013-05-30 NOTE — ED Provider Notes (Signed)
History    CSN: [id number] Arrival date & time 05/30/13  1478  First MD Initiated Contact with Patient 05/30/13 1010     Chief Complaint  Patient presents with  . Abscess   (Consider location/radiation/quality/duration/timing/severity/associated sxs/prior Treatment) HPI  [name] is a(n) 32 y.o. female who presents with chief complaint of abdominal rash.  Patient states that she noticed itching around her C-section scar 3 days ago.  Since that time she has had increased pain heat, redness, swelling.  She's had myalgias, chills, several episodes of loose stool over the past 3 days.  The patient states that her pain is aching, constant.Denies contacts with similar,changes in lotions/soaps/detergents, exposure to animal or plant irritants, and denies purulent discharge. No hx of similar infection. Denies fevers, arthralgias. Denies DOE, SOB, chest tightness or pressure, radiation to left arm, jaw or back, or diaphoresis. Denies dysuria, flank pain, suprapubic pain, frequency, urgency, or hematuria. Denies headaches, light headedness, weakness, visual disturbances. Denies abdominal pain, nausea, vomiting, diarrhea or constipation.   Past Medical History  Diagnosis Date  . Obesity   . Obesity   . GERD (gastroesophageal reflux disease)   . Gallstone (impacted) 05/09/2012  . Normal pregnancy, repeat 05/09/2012  . S/P cesarean section 05/10/2012   Past Surgical History  Procedure Laterality Date  . Cesarean section  [id number]  . Cesarean section  05/10/2012  . Cesarean section  [id number]  . Cesarean section  [id number]  . Cholecystectomy  07/04/2012    Procedure: LAPAROSCOPIC CHOLECYSTECTOMY;  Surgeon: Atilano Ina, MD,FACS;  Location: [facility];  Service: General;  Laterality: N/A;   Family History  Problem Relation Age of Onset  . Anesthesia problems Neg Hx   . Hypotension Neg Hx   . Malignant hyperthermia Neg Hx   . Pseudochol deficiency Neg Hx   . Asthma Mother   . COPD Mother    . Arthritis Mother   . Diabetes Mother   . Hypertension Mother   . Stroke Maternal Aunt   . Diabetes Maternal Aunt   . Birth defects Maternal Aunt     breast  . Stroke Maternal Grandmother   . Diabetes Maternal Grandmother   . Stroke Paternal Grandmother   . Birth defects Paternal Uncle     colon   History  Substance Use Topics  . Smoking status: Never Smoker   . Smokeless tobacco: Never Used  . Alcohol Use: No   OB History   Grav Para Term Preterm Abortions TAB SAB Ect Mult Living   4 4 3 1  0 0 0 0 0 4     Review of Systems  Constitutional: Positive for chills. Negative for fever.  HENT: Negative for trouble swallowing.   Respiratory: Negative for shortness of breath.   Cardiovascular: Negative for chest pain.  Gastrointestinal: Positive for diarrhea. Negative for nausea, vomiting, abdominal pain and constipation.  Genitourinary: Negative for dysuria, urgency, frequency, hematuria, flank pain, vaginal bleeding, vaginal discharge, difficulty urinating, vaginal pain and pelvic pain.  Musculoskeletal: Positive for myalgias. Negative for arthralgias.  Skin: Negative for rash.  Neurological: Negative for weakness and headaches.  All other systems reviewed and are negative.    Allergies  Amoxicillin and Penicillins  Home Medications   Current Outpatient Rx  Name  Route  Sig  Dispense  Refill  . ibuprofen (ADVIL,MOTRIN) 200 MG tablet   Oral   Take 800 mg by mouth every 6 (six) hours as needed.         . Prenatal  Vit-Fe Fumarate-FA (PRENATAL MULTIVITAMIN) TABS   Oral   Take 1 tablet by mouth daily.          BP 125/71  Pulse 101  Temp(Src) 98.3 F (36.8 C) (Oral)  Resp 20  SpO2 95% Physical Exam  Constitutional: She is oriented to person, place, and time. She appears well-developed and well-nourished. No distress.  HENT:  Head: Normocephalic and atraumatic.  Eyes: Conjunctivae are normal. No scleral icterus.  Neck: Normal range of motion.   Cardiovascular: Normal rate, regular rhythm and normal heart sounds.  Exam reveals no gallop and no friction rub.   No murmur heard. Pulmonary/Chest: Effort normal and breath sounds normal. No respiratory distress.  Abdominal: Soft. Bowel sounds are normal. She exhibits no distension and no mass. There is no tenderness. There is no guarding.  Obese abdomen with pendulous panis, Hot, red, and swollen skin extending over the majority of the panis without fluctuance. Creamy, macerated skin with hyperpigmentation  And intertrigo.  Neurological: She is alert and oriented to person, place, and time.  Skin: Skin is warm and dry. She is not diaphoretic.    ED Course  Procedures (including critical care time) Labs Reviewed - No data to display No results found. 1. Cellulitis   2. Pruritic intertrigo     MDM  10:40 AM  BP 125/71  Pulse 101  Temp(Src) 98.3 F (36.8 C) (Oral)  Resp 20  SpO2 95% Patient with mild tacycardia, afebrile. Exam consistent with cellulitis of the abdominal pain is.  Patient likely has underlying fungal infection.  She'll be treated here with one dose of IV clindamycin.  Discharged with oral antibiotics, pain medication, topical antifungals.  Patient may followup with her primary care physician.  IV ABX completed. I spoke with the patient who agrees with the plan of care. The patient appears reasonably screened and/or stabilized for discharge and I doubt any other medical condition or other [facility] requiring further screening, evaluation, or treatment in the ED at this time prior to discharge.   Arthor Captain, PA-C 05/30/13 1210

## 2013-05-30 NOTE — ED Notes (Signed)
MD at bedside. 

## 2013-05-30 NOTE — ED Notes (Signed)
Per pt:  Three days ago, pt noticed on her lower abdomen that a rash formed; it feels like a sharp aching pain.  Pt has taken benedryl. Pt denies nausea, but has had 5 bouts of diarrhea yesterday.

## 2013-05-30 NOTE — ED Provider Notes (Signed)
  This was a shared visit with a mid-level provided (NP or PA).  Throughout the patient's course I was available for consultation/collaboration.  I saw the ECG (if appropriate), relevant labs and studies - I agree with the interpretation.  On my exam the patient was in no distress.  She had a notable cellulitic patch underneath her pannus.      Gerhard Munch, MD 05/30/13 1537

## 2013-08-23 ENCOUNTER — Emergency Department (HOSPITAL_COMMUNITY)
Admission: EM | Admit: 2013-08-23 | Discharge: 2013-08-23 | Disposition: A | Discharge status: Home / Self Care | Payer: No Typology Code available for payment source | Attending: Emergency Medicine | Admitting: Emergency Medicine

## 2013-08-23 ENCOUNTER — Encounter (HOSPITAL_COMMUNITY): Payer: Self-pay | Admitting: Emergency Medicine

## 2013-08-23 ENCOUNTER — Emergency Department (HOSPITAL_COMMUNITY): Payer: No Typology Code available for payment source

## 2013-08-23 DIAGNOSIS — Y92410 Unspecified street and highway as the place of occurrence of the external cause: Secondary | ICD-10-CM | POA: Insufficient documentation

## 2013-08-23 DIAGNOSIS — S8990XA Unspecified injury of unspecified lower leg, initial encounter: Secondary | ICD-10-CM | POA: Insufficient documentation

## 2013-08-23 DIAGNOSIS — M25561 Pain in right knee: Secondary | ICD-10-CM

## 2013-08-23 DIAGNOSIS — Y939 Activity, unspecified: Secondary | ICD-10-CM | POA: Insufficient documentation

## 2013-08-23 DIAGNOSIS — S4980XA Other specified injuries of shoulder and upper arm, unspecified arm, initial encounter: Secondary | ICD-10-CM | POA: Insufficient documentation

## 2013-08-23 DIAGNOSIS — Z880 Allergy status to penicillin: Secondary | ICD-10-CM | POA: Insufficient documentation

## 2013-08-23 DIAGNOSIS — K219 Gastro-esophageal reflux disease without esophagitis: Secondary | ICD-10-CM | POA: Insufficient documentation

## 2013-08-23 DIAGNOSIS — M25511 Pain in right shoulder: Secondary | ICD-10-CM

## 2013-08-23 DIAGNOSIS — R51 Headache: Secondary | ICD-10-CM | POA: Insufficient documentation

## 2013-08-23 DIAGNOSIS — Z881 Allergy status to other antibiotic agents status: Secondary | ICD-10-CM | POA: Insufficient documentation

## 2013-08-23 DIAGNOSIS — Z8719 Personal history of other diseases of the digestive system: Secondary | ICD-10-CM | POA: Insufficient documentation

## 2013-08-23 DIAGNOSIS — S46909A Unspecified injury of unspecified muscle, fascia and tendon at shoulder and upper arm level, unspecified arm, initial encounter: Secondary | ICD-10-CM | POA: Insufficient documentation

## 2013-08-23 MED ORDER — METOCLOPRAMIDE HCL 10 MG PO TABS
10.0000 mg | ORAL_TABLET | Freq: Once | ORAL | Status: AC
  Administered 2013-08-23: 10 mg via ORAL
  Filled 2013-08-23: qty 1

## 2013-08-23 MED ORDER — IBUPROFEN 800 MG PO TABS: 800.0000 mg | ORAL_TABLET | Freq: Three times a day (TID) | ORAL | Status: DC

## 2013-08-23 MED ORDER — KETOROLAC TROMETHAMINE 60 MG/2ML IM SOLN
60.0000 mg | Freq: Once | INTRAMUSCULAR | Status: AC
  Administered 2013-08-23: 60 mg via INTRAMUSCULAR
  Filled 2013-08-23: qty 2

## 2013-08-23 NOTE — ED Notes (Addendum)
Pt reports involved in [facility] September 5 th. Pt continues to experience pain to right knee, right shoulder and intermittent headaches. Pt ambulatory from triage to ED room without assistance. Pt denies Dizziness, Nausea or vomiting Pt has not taken any pain medications today.
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
## 2013-08-24 NOTE — ED Provider Notes (Signed)
I saw and evaluated the patient, reviewed the resident's note and I agree with the findings and plan.   .Face to face Exam:  General:  Awake HEENT:  Atraumatic Resp:  Normal effort Abd:  Nondistended Neuro:No focal weakness  Merrie Epler L Xai Frerking, MD 08/24/13 1106 

## 2014-04-17 ENCOUNTER — Encounter (HOSPITAL_COMMUNITY): Payer: Self-pay | Admitting: Emergency Medicine

## 2014-04-17 ENCOUNTER — Emergency Department (HOSPITAL_COMMUNITY)
Admission: EM | Admit: 2014-04-17 | Discharge: 2014-04-17 | Disposition: A | Discharge status: Home / Self Care | Payer: Medicaid Other | Attending: Emergency Medicine | Admitting: Emergency Medicine

## 2014-04-17 DIAGNOSIS — E669 Obesity, unspecified: Secondary | ICD-10-CM | POA: Insufficient documentation

## 2014-04-17 DIAGNOSIS — IMO0002 Reserved for concepts with insufficient information to code with codable children: Secondary | ICD-10-CM | POA: Insufficient documentation

## 2014-04-17 DIAGNOSIS — Z880 Allergy status to penicillin: Secondary | ICD-10-CM | POA: Insufficient documentation

## 2014-04-17 DIAGNOSIS — L02414 Cutaneous abscess of left upper limb: Secondary | ICD-10-CM

## 2014-04-17 DIAGNOSIS — Z8719 Personal history of other diseases of the digestive system: Secondary | ICD-10-CM | POA: Insufficient documentation

## 2014-04-17 MED ORDER — SULFAMETHOXAZOLE-TRIMETHOPRIM 800-160 MG PO TABS: 1.0000 | ORAL_TABLET | Freq: Two times a day (BID) | ORAL | Status: DC

## 2014-04-17 NOTE — Discharge Instructions (Signed)
Abscess An abscess is an infected area that contains a collection of pus and debris.It can occur in almost any part of the body. An abscess is also known as a furuncle or boil. CAUSES  An abscess occurs when tissue gets infected. This can occur from blockage of oil or sweat glands, infection of hair follicles, or a minor injury to the skin. As the body tries to fight the infection, pus collects in the area and creates pressure under the skin. This pressure causes pain. People with weakened immune systems have difficulty fighting infections and get certain abscesses more often.  SYMPTOMS Usually an abscess develops on the skin and becomes a painful mass that is red, warm, and tender. If the abscess forms under the skin, you may feel a moveable soft area under the skin. Some abscesses break open (rupture) on their own, but most will continue to get worse without care. The infection can spread deeper into the body and eventually into the bloodstream, causing you to feel ill.  DIAGNOSIS  Your caregiver will take your medical history and perform a physical exam. A sample of fluid may also be taken from the abscess to determine what is causing your infection. TREATMENT  Your caregiver may prescribe antibiotic medicines to fight the infection. However, taking antibiotics alone usually does not cure an abscess. Your caregiver may need to make a small cut (incision) in the abscess to drain the pus. In some cases, gauze is packed into the abscess to reduce pain and to continue draining the area. HOME CARE INSTRUCTIONS   Only take over-the-counter or prescription medicines for pain, discomfort, or fever as directed by your caregiver.  If you were prescribed antibiotics, take them as directed. Finish them even if you start to feel better.  If gauze is used, follow your caregiver's directions for changing the gauze.  To avoid spreading the infection:  Keep your draining abscess covered with a  bandage.  Wash your hands well.  Do not share personal care items, towels, or whirlpools with others.  Avoid skin contact with others.  Keep your skin and clothes clean around the abscess.  Keep all follow-up appointments as directed by your caregiver. SEEK MEDICAL CARE IF:   You have increased pain, swelling, redness, fluid drainage, or bleeding.  You have muscle aches, chills, or a general ill feeling.  You have a fever. MAKE SURE YOU:   Understand these instructions.  Will watch your condition.  Will get help right away if you are not doing well or get worse. Document Released: 08/10/2005 Document Revised: 05/01/2012 Document Reviewed: 01/13/2012 [employer] Patient Information 2014 [employer].  

## 2014-04-17 NOTE — ED Notes (Signed)
Pt states bump on arm started about a week ago and was itching.  Pt states wound has gotten worse and started draining 2 days ago.  Pt has been placing antibiotic ointment on site.

## 2014-04-17 NOTE — ED Provider Notes (Signed)
CSN: [id number]     Arrival date & time 04/17/14  [id number] History   First MD Initiated Contact with Patient 04/17/14 [phone]     Chief Complaint  Patient presents with  . Abscess     (Consider location/radiation/quality/duration/timing/severity/associated sxs/prior Treatment) Patient is a 33 y.o. female presenting with abscess. The history is provided by the patient.  Abscess Location:  Shoulder/arm Shoulder/arm abscess location:  L forearm Size:  3 cm Abscess quality: draining, fluctuance, painful, redness, warmth and weeping   Red streaking: no   Duration:  1 week Progression:  Worsening Pain details:    Quality:  Sharp   Severity:  Moderate   Duration:  1 week   Timing:  Constant   Progression:  Worsening Chronicity:  New Context: not immunosuppression, not injected drug use and not skin injury  Insect bite/sting: small lesion, itched and scratched it, worsened into an abscess.   Associated symptoms: no fever     Past Medical History  Diagnosis Date  . Obesity   . Obesity   . GERD (gastroesophageal reflux disease)   . Gallstone (impacted) 05/09/2012  . Normal pregnancy, repeat 05/09/2012  . S/P cesarean section 05/10/2012   Past Surgical History  Procedure Laterality Date  . Cesarean section  [id number]  . Cesarean section  05/10/2012  . Cesarean section  [id number]  . Cesarean section  [id number]  . Cholecystectomy  07/04/2012    Procedure: LAPAROSCOPIC CHOLECYSTECTOMY;  Surgeon: Atilano InaEric M Wilson, MD,FACS;  Location: [facility];  Service: General;  Laterality: N/A;   Family History  Problem Relation Age of Onset  . Anesthesia problems Neg Hx   . Hypotension Neg Hx   . Malignant hyperthermia Neg Hx   . Pseudochol deficiency Neg Hx   . Asthma Mother   . COPD Mother   . Arthritis Mother   . Diabetes Mother   . Hypertension Mother   . Stroke Maternal Aunt   . Diabetes Maternal Aunt   . Birth defects Maternal Aunt     breast  . Stroke Maternal Grandmother   . Diabetes Maternal  Grandmother   . Stroke Paternal Grandmother   . Birth defects Paternal Uncle     colon   History  Substance Use Topics  . Smoking status: Never Smoker   . Smokeless tobacco: Never Used  . Alcohol Use: No   OB History   Grav Para Term Preterm Abortions TAB SAB Ect Mult Living   4 4 3 1  0 0 0 0 0 4     Review of Systems  Constitutional: Negative for fever.  Respiratory: Negative for cough and shortness of breath.   Cardiovascular: Negative for chest pain and leg swelling.  Gastrointestinal: Negative for abdominal pain.  All other systems reviewed and are negative.     Allergies  Amoxicillin and Penicillins  Home Medications   Prior to Admission medications   Medication Sig Start Date End Date Taking? Authorizing Provider  ibuprofen (ADVIL,MOTRIN) 800 MG tablet Take 1 tablet (800 mg total) by mouth 3 (three) times daily with meals. 08/23/13   Dorna LeitzAlex Nickle, MD   BP 112/47  Pulse 69  Temp(Src) 98.1 F (36.7 C) (Oral)  Resp 18  SpO2 100%  LMP 04/14/2014 Physical Exam  Nursing note and vitals reviewed. Constitutional: She is oriented to person, place, and time. She appears well-developed and well-nourished. No distress.  HENT:  Head: Normocephalic and atraumatic.  Eyes: EOM are normal. Pupils are equal, round, and reactive to light.  Neck:  Normal range of motion. Neck supple.  Cardiovascular: Normal rate and regular rhythm.  Exam reveals no friction rub.   No murmur heard. Pulmonary/Chest: Effort normal and breath sounds normal. No respiratory distress. She has no wheezes. She has no rales.  Abdominal: Soft. She exhibits no distension. There is no tenderness. There is no rebound.  Musculoskeletal: Normal range of motion. She exhibits no edema.       Left elbow: She exhibits normal range of motion, no swelling, no effusion, no deformity and no laceration.       Arms: Neurological: She is alert and oriented to person, place, and time.  Skin: She is not diaphoretic.     ED Course  INCISION AND DRAINAGE Date/Time: 04/17/2014 9:22 AM Performed by: Dagmar Hait Authorized by: Dagmar Hait Consent: Verbal consent obtained. Type: abscess Body area: upper extremity Location details: left arm Anesthesia: local infiltration Local anesthetic: lidocaine 1% without epinephrine Anesthetic total: 4 ml Patient sedated: no Scalpel size: 11 Incision type: single straight Complexity: simple Drainage: purulent Drainage amount: moderate Wound treatment: drain placed Packing material: 1/2 in iodoform gauze Patient tolerance: Patient tolerated the procedure well with no immediate complications.   (including critical care time) Labs Review Labs Reviewed - No data to display  Imaging Review No results found.   EKG Interpretation None      MDM   Final diagnoses:  Abscess of left forearm    66F here with L proximal dorsal forearm abscess. No involvement of elbow joint, no effusion, no concern for septic arthritis. I&D as above, mild surrounding induration, will place on antibiotics. Stable for discharge.    Dagmar Hait, MD 04/17/14 [phone]

## 2014-04-19 ENCOUNTER — Encounter (HOSPITAL_COMMUNITY): Payer: Self-pay | Admitting: Emergency Medicine

## 2014-04-19 ENCOUNTER — Emergency Department (HOSPITAL_COMMUNITY)
Admission: EM | Admit: 2014-04-19 | Discharge: 2014-04-19 | Disposition: A | Discharge status: Home / Self Care | Payer: Medicaid Other | Attending: Emergency Medicine | Admitting: Emergency Medicine

## 2014-04-19 DIAGNOSIS — E669 Obesity, unspecified: Secondary | ICD-10-CM | POA: Insufficient documentation

## 2014-04-19 DIAGNOSIS — Z880 Allergy status to penicillin: Secondary | ICD-10-CM | POA: Insufficient documentation

## 2014-04-19 DIAGNOSIS — Z4801 Encounter for change or removal of surgical wound dressing: Secondary | ICD-10-CM | POA: Insufficient documentation

## 2014-04-19 DIAGNOSIS — Z5189 Encounter for other specified aftercare: Secondary | ICD-10-CM

## 2014-04-19 DIAGNOSIS — L0291 Cutaneous abscess, unspecified: Secondary | ICD-10-CM

## 2014-04-19 DIAGNOSIS — Z8719 Personal history of other diseases of the digestive system: Secondary | ICD-10-CM | POA: Insufficient documentation

## 2014-04-19 DIAGNOSIS — Z87442 Personal history of urinary calculi: Secondary | ICD-10-CM | POA: Insufficient documentation

## 2014-04-19 NOTE — Discharge Instructions (Signed)
°  Take your antibiotics as directed and to completion. You should never have any leftover antibiotics! Push fluids and stay well hydrated.   Please follow with your primary care doctor in the next 2 days for a check-up. They must obtain records for further management.   Do not hesitate to return to the Emergency Department for any new, worsening or concerning symptoms.    Abscess Care After An abscess (also called a boil or furuncle) is an infected area that contains a collection of pus. Signs and symptoms of an abscess include pain, tenderness, redness, or hardness, or you may feel a moveable soft area under your skin. An abscess can occur anywhere in the body. The infection may spread to surrounding tissues causing cellulitis. A cut (incision) by the surgeon was made over your abscess and the pus was drained out. Gauze may have been packed into the space to provide a drain that will allow the cavity to heal from the inside outwards. The boil may be painful for 5 to 7 days. Most people with a boil do not have high fevers. Your abscess, if seen early, may not have localized, and may not have been lanced. If not, another appointment may be required for this if it does not get better on its own or with medications. HOME CARE INSTRUCTIONS   Only take over-the-counter or prescription medicines for pain, discomfort, or fever as directed by your caregiver.  When you bathe, soak and then remove gauze or iodoform packs at least daily or as directed by your caregiver. You may then wash the wound gently with mild soapy water. Repack with gauze or do as your caregiver directs. SEEK IMMEDIATE MEDICAL CARE IF:   You develop increased pain, swelling, redness, drainage, or bleeding in the wound site.  You develop signs of generalized infection including muscle aches, chills, fever, or a general ill feeling.  An oral temperature above 102 F (38.9 C) develops, not controlled by medication. See your caregiver  for a recheck if you develop any of the symptoms described above. If medications (antibiotics) were prescribed, take them as directed. Document Released: 05/19/2005 Document Revised: 01/23/2012 Document Reviewed: 01/14/2008 [facility] Patient Information 2014 [place], [place].

## 2014-04-19 NOTE — ED Provider Notes (Signed)
CSN: [id number]     Arrival date & time 04/19/14  1118 History   This chart was scribed for Wynetta Emery, PA-C, working with Junius Argyle, MD by Blanchard Kelch, ED Scribe. This patient was seen in room WTR9/WTR9 and the patient's care was started at 12:17 PM.     Chief Complaint  Patient presents with  . Wound Check    abscess on l/elbow packed 2 days ago     Patient is a 33 y.o. female presenting with wound check. The history is provided by the patient. No language interpreter was used.  Wound Check    HPI Comments: [name] is a 33 y.o. female who presents to the Emergency Department for a wound recheck. She was seen two days ago for an abscess to her left upper forearm. Dr. Gwendolyn Grant performed an I&D with packing and discharged her with a prescription for Septra DS. She returns today for a recheck and to have her packing removed. She reports taking her antibiotics compliantly starting yesterday. She denies fever, nausea or vomiting since last being seen. She reports the area has been draining. She denies a past medical history of abscesses or diabetes.   Past Medical History  Diagnosis Date  . Obesity   . Obesity   . GERD (gastroesophageal reflux disease)   . Gallstone (impacted) 05/09/2012  . Normal pregnancy, repeat 05/09/2012  . S/P cesarean section 05/10/2012   Past Surgical History  Procedure Laterality Date  . Cesarean section  [id number]  . Cesarean section  05/10/2012  . Cesarean section  [id number]  . Cesarean section  [id number]  . Cholecystectomy  07/04/2012    Procedure: LAPAROSCOPIC CHOLECYSTECTOMY;  Surgeon: Atilano Ina, MD,FACS;  Location: [facility];  Service: General;  Laterality: N/A;   Family History  Problem Relation Age of Onset  . Anesthesia problems Neg Hx   . Hypotension Neg Hx   . Malignant hyperthermia Neg Hx   . Pseudochol deficiency Neg Hx   . Asthma Mother   . COPD Mother   . Arthritis Mother   . Diabetes Mother   . Hypertension Mother    . Stroke Maternal Aunt   . Diabetes Maternal Aunt   . Birth defects Maternal Aunt     breast  . Stroke Maternal Grandmother   . Diabetes Maternal Grandmother   . Stroke Paternal Grandmother   . Birth defects Paternal Uncle     colon   History  Substance Use Topics  . Smoking status: Never Smoker   . Smokeless tobacco: Never Used  . Alcohol Use: No   OB History   Grav Para Term Preterm Abortions TAB SAB Ect Mult Living   4 4 3 1  0 0 0 0 0 4     Review of Systems  A complete 10 system review of systems was obtained and all systems are negative except as noted in the HPI and PMH.      Allergies  Amoxicillin and Penicillins  Home Medications   Prior to Admission medications   Medication Sig Start Date End Date Taking? Authorizing Provider  acetaminophen (TYLENOL) 500 MG tablet Take 500 mg by mouth every 6 (six) hours as needed for headache.   Yes Historical Provider, MD  sulfamethoxazole-trimethoprim (SEPTRA DS) 800-160 MG per tablet Take 1 tablet by mouth every 12 (twelve) hours. 04/17/14  Yes Dagmar Hait, MD   Triage Vitals: BP 114/69  Pulse 75  Temp(Src) 98.1 F (36.7 C) (Oral)  Resp 18  SpO2 98%  LMP 04/14/2014  Breastfeeding? Yes  Physical Exam  Nursing note and vitals reviewed. Constitutional: She is oriented to person, place, and time. She appears well-developed and well-nourished. No distress.  HENT:  Head: Normocephalic and atraumatic.  Eyes: Conjunctivae and EOM are normal.  Neck: Normal range of motion. No tracheal deviation present.  Cardiovascular: Normal rate.   Pulmonary/Chest: Effort normal. No respiratory distress.  Musculoskeletal: Normal range of motion.  Neurological: She is alert and oriented to person, place, and time.  Skin: Skin is warm and dry.     Psychiatric: She has a normal mood and affect. Her behavior is normal.    ED Course  Procedures (including critical care time)  DIAGNOSTIC STUDIES: Oxygen Saturation is 98% on  room air, normal by my interpretation.    COORDINATION OF CARE: 12:17 PM -Packing removed. Patient tolerated procedure well. Return precautions discussed. Patient verbalizes understanding and agrees with treatment plan.    Labs Review Labs Reviewed - No data to display  Imaging Review No results found.   EKG Interpretation None      MDM   Final diagnoses:  Abscess  Wound check, abscess    Filed Vitals:   04/19/14 1125 04/19/14 1245  BP: 114/69   Pulse: 75 74  Temp: 98.1 F (36.7 C)   TempSrc: Oral   Resp: 18   SpO2: 98% 99%    Medications - No data to display  [name] is a 33 y.o. female presenting with for abscess wound check. Packing removed, there was no drainage. Wound was not repacked. Advised her to remain compliant with Bactrim. Discussed return precautions.  Evaluation does not show pathology that would require ongoing emergent intervention or inpatient treatment. Pt is hemodynamically stable and mentating appropriately. Discussed findings and plan with patient/guardian, who agrees with care plan. All questions answered. Return precautions discussed and outpatient follow up given.    I personally performed the services described in this documentation, which was scribed in my presence. The recorded information has been reviewed and is accurate.    Wynetta Emeryicole Nicolo Tomko, PA-C 04/19/14 2025

## 2014-04-19 NOTE — ED Notes (Signed)
Pt returned to ED to have packing on l/elbow removed

## 2014-04-20 NOTE — ED Provider Notes (Signed)
Medical screening examination/treatment/procedure(s) were performed by non-physician practitioner and as supervising physician I was immediately available for consultation/collaboration.   EKG Interpretation None        Junius Argyle, MD 04/20/14 [phone]

## 2014-09-15 ENCOUNTER — Encounter (HOSPITAL_COMMUNITY): Payer: Self-pay | Admitting: Emergency Medicine

## 2014-11-27 ENCOUNTER — Encounter (HOSPITAL_COMMUNITY): Payer: Self-pay | Admitting: Obstetrics and Gynecology

## 2015-02-13 ENCOUNTER — Encounter (HOSPITAL_COMMUNITY): Payer: Self-pay

## 2015-02-13 ENCOUNTER — Emergency Department (HOSPITAL_COMMUNITY)
Admission: EM | Admit: 2015-02-13 | Discharge: 2015-02-13 | Disposition: A | Discharge status: Home / Self Care | Payer: Medicaid Other | Attending: Emergency Medicine | Admitting: Emergency Medicine

## 2015-02-13 DIAGNOSIS — Z8719 Personal history of other diseases of the digestive system: Secondary | ICD-10-CM | POA: Insufficient documentation

## 2015-02-13 DIAGNOSIS — J029 Acute pharyngitis, unspecified: Secondary | ICD-10-CM

## 2015-02-13 DIAGNOSIS — E669 Obesity, unspecified: Secondary | ICD-10-CM | POA: Insufficient documentation

## 2015-02-13 DIAGNOSIS — Z880 Allergy status to penicillin: Secondary | ICD-10-CM | POA: Insufficient documentation

## 2015-02-13 LAB — RAPID STREP SCREEN (MED CTR MEBANE ONLY): Streptococcus, Group A Screen (Direct): NEGATIVE

## 2015-02-13 MED ORDER — IBUPROFEN 400 MG PO TABS
800.0000 mg | ORAL_TABLET | Freq: Once | ORAL | Status: AC
  Administered 2015-02-13: 800 mg via ORAL
  Filled 2015-02-13: qty 2

## 2015-02-13 MED ORDER — IBUPROFEN 800 MG PO TABS: 800.0000 mg | ORAL_TABLET | Freq: Three times a day (TID) | ORAL | Status: DC | PRN

## 2015-02-13 MED ORDER — HYDROCODONE-ACETAMINOPHEN 7.5-325 MG/15ML PO SOLN: 10.0000 mL | Freq: Four times a day (QID) | ORAL | Status: DC | PRN

## 2015-02-13 NOTE — ED Provider Notes (Signed)
CSN: [id number]     Arrival date & time 02/13/15  0917 History  This chart was scribed for non-physician practitioner, Trixie DredgeEmily Zaccheaus Storlie, PA-C, working with Gwyneth SproutWhitney Plunkett, MD by Charline BillsEssence Howell, ED Scribe. This patient was seen in room TR08C/TR08C and the patient's care was started at 9:53 AM.   Chief Complaint  Patient presents with  . Sore Throat   The history is provided by the patient. No language interpreter was used.   HPI Comments: [name] is a 34 y.o. female who presents to the Emergency Department complaining of gradually worsening sore throat since yesterday. Pt reports a burning sensation that is exacerbated with swallowing. She reports associated rhinorrhea, difficulty breathing last night that has resolved, fever of 103.2 F yesterday, body aches, hoarseness, congestion, productive cough with "slimy green" mucous. Pt denies chest pain, SOB. She has been treating with Tylenol with mild relief. No other alleviating or aggravating factors. Pt has 2 sick children with similar symptoms. Allergies to penicillin and amoxicillin.  Past Medical History  Diagnosis Date  . Obesity   . Obesity   . GERD (gastroesophageal reflux disease)   . Gallstone (impacted) 05/09/2012  . Normal pregnancy, repeat 05/09/2012  . S/P cesarean section 05/10/2012   Past Surgical History  Procedure Laterality Date  . Cesarean section  [id number]  . Cesarean section  05/10/2012  . Cesarean section  [id number]  . Cesarean section  [id number]  . Cholecystectomy  07/04/2012    Procedure: LAPAROSCOPIC CHOLECYSTECTOMY;  Surgeon: Atilano InaEric M Wilson, MD,FACS;  Location: [facility];  Service: General;  Laterality: N/A;   Family History  Problem Relation Age of Onset  . Anesthesia problems Neg Hx   . Hypotension Neg Hx   . Malignant hyperthermia Neg Hx   . Pseudochol deficiency Neg Hx   . Asthma Mother   . COPD Mother   . Arthritis Mother   . Diabetes Mother   . Hypertension Mother   . Stroke Maternal Aunt   . Diabetes  Maternal Aunt   . Birth defects Maternal Aunt     breast  . Stroke Maternal Grandmother   . Diabetes Maternal Grandmother   . Stroke Paternal Grandmother   . Birth defects Paternal Uncle     colon   History  Substance Use Topics  . Smoking status: Never Smoker   . Smokeless tobacco: Never Used  . Alcohol Use: No   OB History    Gravida Para Term Preterm AB TAB SAB Ectopic Multiple Living   4 4 3 1  0 0 0 0 0 4     Review of Systems  Constitutional: Positive for fever.  HENT: Positive for congestion, rhinorrhea, sore throat and voice change.   Respiratory: Positive for cough. Negative for shortness of breath.   Cardiovascular: Negative for chest pain.  Musculoskeletal: Positive for myalgias.  Skin: Negative for rash.  Allergic/Immunologic: Negative for immunocompromised state.  Hematological: Does not bruise/bleed easily.  Psychiatric/Behavioral: Negative for self-injury.   Allergies  Amoxicillin and Penicillins  Home Medications   Prior to Admission medications   Medication Sig Start Date End Date Taking? Authorizing Provider  acetaminophen (TYLENOL) 500 MG tablet Take 500 mg by mouth every 6 (six) hours as needed for headache.    Historical Provider, MD  sulfamethoxazole-trimethoprim (SEPTRA DS) 800-160 MG per tablet Take 1 tablet by mouth every 12 (twelve) hours. 04/17/14   Elwin MochaBlair Walden, MD   BP 128/64 mmHg  Pulse 72  Temp(Src) 98 F (36.7 C) (Oral)  Resp  18  Ht  (1.549 m)  Wt 220 lb (99.791 kg)  BMI 41.59 kg/m2  SpO2 100%  LMP 01/10/2015 Physical Exam  Constitutional: She appears well-developed and well-nourished. No distress.  HENT:  Head: Normocephalic and atraumatic.  Mouth/Throat: Posterior oropharyngeal edema (mild) and posterior oropharyngeal erythema present. No oropharyngeal exudate or tonsillar abscesses.  Eyes: Conjunctivae are normal.  Neck: Neck supple.  Cardiovascular: Normal rate and regular rhythm.   Pulmonary/Chest: Effort normal and  breath sounds normal. No respiratory distress. She has no wheezes. She has no rales.  Lymphadenopathy:    She has cervical adenopathy (tender anterior).  Neurological: She is alert.  Skin: She is not diaphoretic.  Nursing note and vitals reviewed.  ED Course  Procedures (including critical care time) DIAGNOSTIC STUDIES: Oxygen Saturation is 100% on RA, normal by my interpretation.    COORDINATION OF CARE: 9:57 AM-Discussed treatment plan which includes strep screen with pt at bedside and pt agreed to plan.   Labs Review Labs Reviewed  RAPID STREP SCREEN  CULTURE, GROUP A STREP   Imaging Review No results found.   EKG Interpretation None      MDM   Final diagnoses:  Pharyngitis    Afebrile, nontoxic patient with constellation of symptoms suggestive of viral syndrome.  Sick contacts at home.  No concerning findings on exam.  No airway concerns.  Discharged home with supportive care, PCP follow up.  Discussed result, findings, treatment, and follow up  with patient.  Pt given return precautions.  Pt verbalizes understanding and agrees with plan.      I personally performed the services described in this documentation, which was scribed in my presence. The recorded information has been reviewed and is accurate.    Trixie Dredge, PA-C 02/13/15 1046  Gwyneth Sprout, MD 02/14/15 [phone]

## 2015-02-13 NOTE — ED Notes (Signed)
C/o sorethroat x 3 days. Today, has laryngitis.

## 2015-02-13 NOTE — Discharge Instructions (Signed)
Read the information below.  Use the prescribed medication as directed.  Please discuss all new medications with your pharmacist.  Do not take additional tylenol while taking the prescribed pain medication to avoid overdose.  You may return to the Emergency Department at any time for worsening condition or any new symptoms that concern you.  If there is any possibility that you might be pregnant, please let your health care provider know and discuss this with the pharmacist to ensure medication safety.  If you develop high fevers that do not resolve with tylenol or ibuprofen, you have difficulty swallowing or breathing, or you are unable to tolerate fluids by mouth, return to the ER for a recheck.       Pharyngitis Pharyngitis is a sore throat (pharynx). There is redness, pain, and swelling of your throat. HOME CARE   Drink enough fluids to keep your pee (urine) clear or pale yellow.  Only take medicine as told by your doctor.  You may get sick again if you do not take medicine as told. Finish your medicines, even if you start to feel better.  Do not take aspirin.  Rest.  Rinse your mouth (gargle) with salt water ( tsp of salt per 1 qt of water) every 1-2 hours. This will help the pain.  If you are not at risk for choking, you can suck on hard candy or sore throat lozenges. GET HELP IF:  You have large, tender lumps on your neck.  You have a rash.  You cough up green, yellow-brown, or bloody spit. GET HELP RIGHT AWAY IF:   You have a stiff neck.  You drool or cannot swallow liquids.  You throw up (vomit) or are not able to keep medicine or liquids down.  You have very bad pain that does not go away with medicine.  You have problems breathing (not from a stuffy nose). MAKE SURE YOU:   Understand these instructions.  Will watch your condition.  Will get help right away if you are not doing well or get worse. Document Released: 04/18/2008 Document Revised: 08/21/2013  Document Reviewed: 07/08/2013 [facility] Patient Information 2015 [place], [place]. This information is not intended to replace advice given to you by your health care provider. Make sure you discuss any questions you have with your health care provider.  Viral Infections A virus is a type of germ. Viruses can cause:  Minor sore throats.  Aches and pains.  Headaches.  Runny nose.  Rashes.  Watery eyes.  Tiredness.  Coughs.  Loss of appetite.  Feeling sick to your stomach (nausea).  Throwing up (vomiting).  Watery poop (diarrhea). HOME CARE   Only take medicines as told by your doctor.  Drink enough water and fluids to keep your pee (urine) clear or pale yellow. Sports drinks are a good choice.  Get plenty of rest and eat healthy. Soups and broths with crackers or rice are fine. GET HELP RIGHT AWAY IF:   You have a very bad headache.  You have shortness of breath.  You have chest pain or neck pain.  You have an unusual rash.  You cannot stop throwing up.  You have watery poop that does not stop.  You cannot keep fluids down.  You or your child has a temperature by mouth above 102 F (38.9 C), not controlled by medicine.  Your baby is older than 3 months with a rectal temperature of 102 F (38.9 C) or higher.  Your baby is 3 months  old or younger with a rectal temperature of 100.4 F (38 C) or higher. MAKE SURE YOU:   Understand these instructions.  Will watch this condition.  Will get help right away if you are not doing well or get worse. Document Released: 10/13/2008 Document Revised: 01/23/2012 Document Reviewed: 03/08/2011 [facility] Patient Information 2015 [place]. This information is not intended to replace advice given to you by your health care provider. Make sure you discuss any questions you have with your health care provider.

## 2015-02-13 NOTE — ED Notes (Signed)
Pt. Developed a sore throat and rt. Side headache on Sunday night with productive yellow cough and nasal congestion.  Pt. Denies any fevers.  Difficulty swallowing

## 2015-02-16 LAB — CULTURE, GROUP A STREP: STREP A CULTURE: POSITIVE — AB

## 2015-02-17 ENCOUNTER — Telehealth: Payer: Self-pay | Admitting: *Deleted

## 2015-02-17 ENCOUNTER — Telehealth (HOSPITAL_BASED_OUTPATIENT_CLINIC_OR_DEPARTMENT_OTHER): Payer: Self-pay | Admitting: Emergency Medicine

## 2015-02-17 NOTE — Progress Notes (Signed)
ED Antimicrobial Stewardship Positive Culture Follow Up   [name] is an 34 y.o. female who [facility] with a chief complaint of  Chief Complaint  Patient presents with  . Sore Throat    Recent Results (from the past 720 hour(s))  Rapid strep screen     Status: None   Collection Time: 02/13/15 10:02 AM  Result Value Ref Range Status   Streptococcus, Group A Screen (Direct) NEGATIVE NEGATIVE Final    Comment: (NOTE) A Rapid Antigen test may result negative if the antigen level in the sample is below the detection level of this test. The FDA has not cleared this test as a stand-alone test therefore the rapid antigen negative result has reflexed to a Group A Strep culture.   Culture, Group A Strep     Status: Abnormal   Collection Time: 02/13/15 10:02 AM  Result Value Ref Range Status   Strep A Culture Positive (A)  Corrected    Comment: (NOTE) Performed At: [facility] [address] [place], [place] [id number] Mila HomerHancock William F MD WU:[id number]:[phone] CORRECTED ON 04/04 AT 0435: PREVIOUSLY REPORTED AS Comment      [x]  Patient discharged originally without antimicrobial agent and treatment is now indicated  New antibiotic prescription: Azithromycin 500mg  po daily x 5 days  ED Provider: Celene Skeenobyn Hess, PA-C   Mickeal SkinnerFrens, Lizvet Chunn John 02/17/2015, 10:45 AM Infectious Diseases Pharmacist Phone# [phone]

## 2015-02-17 NOTE — Telephone Encounter (Signed)
Chart reviewed.  Spoke with pt concerning his medications and not being able to afford them.  Explained the MATCH- Medication Assistance program to pt. Pt understands that the MATCH program is only a one time in one year from the date of discharge to next year. Pt also understands that there is a $3.00 co pay for each prescription.  

## 2015-02-17 NOTE — Telephone Encounter (Signed)
Post ED Visit - Positive Culture Follow-up: Successful Patient Follow-Up  Culture assessed and recommendations reviewed by: []  Wes Dulaney, Pharm.D., BCPS []  Celedonio MiyamotoJeremy Frens, Pharm.D., BCPS []  Georgina PillionElizabeth Martin, Pharm.D., BCPS [x]  SunrayMinh Pham, [place]., BCPS, AAHIVP []  Estella HuskMichelle Turner, Pharm.D., BCPS, AAHIVP []  Red ChristiansSamson Lee, Pharm.D. []  Tennis Mustassie Stewart, Pharm.D.  Positive strep culture  [x]  Patient discharged without antimicrobial prescription and treatment is now indicated []  Organism is resistant to prescribed ED discharge antimicrobial []  Patient with positive blood cultures  Changes discussed with ED provider:Robyn Hess PA New antibiotic prescription Azithromycin 500mg  po daily x 5 days [facility] [address] [phone]  Contacted patient, date 02/17/15 1245  Berle MullMiller, [place] 02/17/2015, 12:52 PM

## 2015-02-18 NOTE — Progress Notes (Signed)
ED CM received call from Flow Manager Joyce GrossKay concerning patient receiving MATCH letter, patient [facility]. Patient was seen in the ED 4/1, strep culture came back pos yesterday, a prescription for Azithromycin 500mg   X 5 days [facility] [employer]. Checked the goodrx list. Contacted patient she confirmed she was uninsured. Discussed Azithromycin 500 mg po x 5 day cost $11.73 patient agreeable to use goodrx  coupon instead of MATCH program.  Patient agreeable for CM to text coupon to patient's phone, patient confirmed receipt. No further ED CM needs identified.

## 2015-04-15 ENCOUNTER — Emergency Department (HOSPITAL_COMMUNITY)
Admission: EM | Admit: 2015-04-15 | Discharge: 2015-04-15 | Disposition: A | Discharge status: Home / Self Care | Payer: Medicaid Other | Attending: Emergency Medicine | Admitting: Emergency Medicine

## 2015-04-15 ENCOUNTER — Encounter (HOSPITAL_COMMUNITY): Payer: Self-pay | Admitting: *Deleted

## 2015-04-15 DIAGNOSIS — R197 Diarrhea, unspecified: Secondary | ICD-10-CM | POA: Insufficient documentation

## 2015-04-15 DIAGNOSIS — R51 Headache: Secondary | ICD-10-CM | POA: Insufficient documentation

## 2015-04-15 DIAGNOSIS — Z792 Long term (current) use of antibiotics: Secondary | ICD-10-CM | POA: Insufficient documentation

## 2015-04-15 DIAGNOSIS — Z8719 Personal history of other diseases of the digestive system: Secondary | ICD-10-CM | POA: Insufficient documentation

## 2015-04-15 DIAGNOSIS — R519 Headache, unspecified: Secondary | ICD-10-CM

## 2015-04-15 DIAGNOSIS — E669 Obesity, unspecified: Secondary | ICD-10-CM | POA: Insufficient documentation

## 2015-04-15 DIAGNOSIS — Z880 Allergy status to penicillin: Secondary | ICD-10-CM | POA: Insufficient documentation

## 2015-04-15 LAB — CBC WITH DIFFERENTIAL/PLATELET
BASOS PCT: 0 % (ref 0–1)
Basophils Absolute: 0 10*3/uL (ref 0.0–0.1)
Eosinophils Absolute: 0.1 10*3/uL (ref 0.0–0.7)
Eosinophils Relative: 1 % (ref 0–5)
HEMATOCRIT: 34 % — AB (ref 36.0–46.0)
Hemoglobin: 10.9 g/dL — ABNORMAL LOW (ref 12.0–15.0)
Lymphocytes Relative: 25 % (ref 12–46)
Lymphs Abs: 1.5 10*3/uL (ref 0.7–4.0)
MCH: 29 pg (ref 26.0–34.0)
MCHC: 32.1 g/dL (ref 30.0–36.0)
MCV: 90.4 fL (ref 78.0–100.0)
Monocytes Absolute: 0.4 10*3/uL (ref 0.1–1.0)
Monocytes Relative: 7 % (ref 3–12)
Neutro Abs: 4.1 10*3/uL (ref 1.7–7.7)
Neutrophils Relative %: 67 % (ref 43–77)
PLATELETS: 178 10*3/uL (ref 150–400)
RBC: 3.76 MIL/uL — AB (ref 3.87–5.11)
RDW: 12.5 % (ref 11.5–15.5)
WBC: 6.1 10*3/uL (ref 4.0–10.5)

## 2015-04-15 LAB — BASIC METABOLIC PANEL
Anion gap: 8 (ref 5–15)
BUN: 11 mg/dL (ref 6–20)
CALCIUM: 8.4 mg/dL — AB (ref 8.9–10.3)
CO2: 26 mmol/L (ref 22–32)
CREATININE: 0.74 mg/dL (ref 0.44–1.00)
Chloride: 105 mmol/L (ref 101–111)
Glucose, Bld: 91 mg/dL (ref 65–99)
POTASSIUM: 3.5 mmol/L (ref 3.5–5.1)
Sodium: 139 mmol/L (ref 135–145)

## 2015-04-15 MED ORDER — SODIUM CHLORIDE 0.9 % IV BOLUS (SEPSIS)
1000.0000 mL | Freq: Once | INTRAVENOUS | Status: AC
  Administered 2015-04-15: 1000 mL via INTRAVENOUS

## 2015-04-15 MED ORDER — PROCHLORPERAZINE EDISYLATE 5 MG/ML IJ SOLN
10.0000 mg | Freq: Once | INTRAMUSCULAR | Status: AC
  Administered 2015-04-15: 10 mg via INTRAVENOUS
  Filled 2015-04-15: qty 2

## 2015-04-15 MED ORDER — ONDANSETRON 8 MG PO TBDP: 8.0000 mg | ORAL_TABLET | Freq: Three times a day (TID) | ORAL | Status: AC | PRN

## 2015-04-15 MED ORDER — KETOROLAC TROMETHAMINE 30 MG/ML IJ SOLN
30.0000 mg | Freq: Once | INTRAMUSCULAR | Status: AC
  Administered 2015-04-15: 30 mg via INTRAVENOUS
  Filled 2015-04-15: qty 1

## 2015-04-15 NOTE — ED Provider Notes (Signed)
CSN: [id number]     Arrival date & time 04/15/15  1019 History   First MD Initiated Contact with Patient 04/15/15 1056     Chief Complaint  Patient presents with  . Migraine  . Diarrhea     (Consider location/radiation/quality/duration/timing/severity/associated sxs/prior Treatment) Patient is a 34 y.o. female presenting with migraines and diarrhea. The history is provided by the patient and medical records.  Migraine Associated symptoms include headaches.  Diarrhea Associated symptoms: headaches    34 year old female with history of obesity, GERD, presenting to the ED for migraine headache for the past 3 days. States initially headache was intermittent but is calm constant over the past 24 hours. Headache localized to her right forehead, described as throbbing in nature. She reports associated photophobia, phonophobia, and nausea. She denies any dizziness, lightheadedness, tinnitus, changes in speech, numbness, weakness, confusion, or ataxia. She also reports diarrhea over the past 24 hours. Her children were recently sick with similar symptoms. She denies any recent travel or abnormal food intake. No fever or chills. She does endorse some abdominal cramping. She denies vomiting. She has taken Tylenol and Advil prior to arrival without relief.  Past Medical History  Diagnosis Date  . Obesity   . Obesity   . GERD (gastroesophageal reflux disease)   . Gallstone (impacted) 05/09/2012  . Normal pregnancy, repeat 05/09/2012  . S/P cesarean section 05/10/2012   Past Surgical History  Procedure Laterality Date  . Cesarean section  [id number]  . Cesarean section  05/10/2012  . Cesarean section  [id number]  . Cesarean section  [id number]  . Cholecystectomy  07/04/2012    Procedure: LAPAROSCOPIC CHOLECYSTECTOMY;  Surgeon: Atilano Ina, MD,FACS;  Location: [facility];  Service: General;  Laterality: N/A;   Family History  Problem Relation Age of Onset  . Anesthesia problems Neg Hx   . Hypotension Neg Hx    . Malignant hyperthermia Neg Hx   . Pseudochol deficiency Neg Hx   . Asthma Mother   . COPD Mother   . Arthritis Mother   . Diabetes Mother   . Hypertension Mother   . Stroke Maternal Aunt   . Diabetes Maternal Aunt   . Birth defects Maternal Aunt     breast  . Stroke Maternal Grandmother   . Diabetes Maternal Grandmother   . Stroke Paternal Grandmother   . Birth defects Paternal Uncle     colon   History  Substance Use Topics  . Smoking status: Never Smoker   . Smokeless tobacco: Never Used  . Alcohol Use: No   OB History    Gravida Para Term Preterm AB TAB SAB Ectopic Multiple Living   0 0 0 0 0 4     Review of Systems  Gastrointestinal: Positive for diarrhea.  Neurological: Positive for headaches.  All other systems reviewed and are negative.     Allergies  Amoxicillin and Penicillins  Home Medications   Prior to Admission medications   Medication Sig Start Date End Date Taking? Authorizing Provider  acetaminophen (TYLENOL) 500 MG tablet Take 500 mg by mouth every 6 (six) hours as needed for headache.    Historical Provider, MD  HYDROcodone-acetaminophen (HYCET) 7.5-325 mg/15 ml solution Take 10 mLs by mouth 4 (four) times daily as needed for moderate pain or severe pain. 02/13/15   Trixie Dredge, PA-C  ibuprofen (ADVIL,MOTRIN) 800 MG tablet Take 1 tablet (800 mg total) by mouth every 8 (eight) hours as needed for mild pain or moderate  pain. 02/13/15   Trixie DredgeEmily West, PA-C  sulfamethoxazole-trimethoprim (SEPTRA DS) 800-160 MG per tablet Take 1 tablet by mouth every 12 (twelve) hours. 04/17/14   Elwin MochaBlair Walden, MD   BP 117/69 mmHg  Pulse 70  Temp(Src) 97.7 F (36.5 C) (Oral)  Resp 18  SpO2 97%   Physical Exam  Constitutional: She is oriented to person, place, and time. She appears well-developed and well-nourished. No distress.  HENT:  Head: Normocephalic and atraumatic.  Mouth/Throat: Oropharynx is clear and moist.  Eyes: Conjunctivae and EOM are normal.  Pupils are equal, round, and reactive to light.  Neck: Normal range of motion and full passive range of motion without pain. Neck supple. No rigidity.  No meningismus  Cardiovascular: Normal rate, regular rhythm and normal heart sounds.   Pulmonary/Chest: Effort normal and breath sounds normal. No respiratory distress. She has no wheezes.  Abdominal: Soft. Bowel sounds are normal. There is no tenderness. There is no guarding.  Musculoskeletal: Normal range of motion.  Neurological: She is alert and oriented to person, place, and time.  AAOx3, answering questions appropriately; equal strength UE and LE bilaterally; CN grossly intact; moves all extremities appropriately without ataxia; no focal neuro deficits or facial asymmetry appreciated  Skin: Skin is warm and dry. She is not diaphoretic.  Psychiatric: She has a normal mood and affect.  Nursing note and vitals reviewed.   ED Course  Procedures (including critical care time) Labs Review Labs Reviewed  CBC WITH DIFFERENTIAL/PLATELET - Abnormal; Notable for the following:    RBC 3.76 (*)    Hemoglobin 10.9 (*)    HCT 34.0 (*)    All other components within normal limits  BASIC METABOLIC PANEL - Abnormal; Notable for the following:    Calcium 8.4 (*)    All other components within normal limits    Imaging Review No results found.   EKG Interpretation None      MDM   Final diagnoses:  Headache, unspecified headache type  Diarrhea   34 year old female here with headache and diarrhea.  Children recently sick with diarrhea as well.  Patient afebrile, non-toxic.  Abdomen exam is benign.  No nuchal rigidity to suggest meningitis.  Neurologic exam is non-focal-- doubt ICH, SAH, TIA, CVA, or meningitis.  Lab work is reassuring.  Patient treated with toradol and compazine with resolution of headache.  States she is feeling better and wants to go home.  Patient discharged home with symptomatic care.  Discussed plan with patient, he/she  acknowledged understanding and agreed with plan of care.  Return precautions given for new or worsening symptoms.    Garlon HatchetLisa M Deshondra Worst, PA-C 04/15/15 1411  Azalia BilisKevin Campos, MD 04/15/15 [phone]

## 2015-04-15 NOTE — ED Notes (Signed)
Pt reports migraine for 3 days. Pt reports sensitivity to light and sound. Pt also reports diarrhea. Pt reports that her children had the same earlier in the week.

## 2015-04-15 NOTE — ED Notes (Signed)
Pt requesting her IV be removed, pt aware that she has not completed her fluids, pt states, "I don't care, I can't take it." IV removed

## 2015-04-15 NOTE — Discharge Instructions (Signed)

## 2015-04-22 ENCOUNTER — Encounter (HOSPITAL_COMMUNITY): Payer: Self-pay | Admitting: *Deleted

## 2015-04-22 ENCOUNTER — Emergency Department (HOSPITAL_COMMUNITY)
Admission: EM | Admit: 2015-04-22 | Discharge: 2015-04-22 | Disposition: A | Discharge status: Home / Self Care | Payer: Medicaid Other | Attending: Emergency Medicine | Admitting: Emergency Medicine

## 2015-04-22 DIAGNOSIS — Z8719 Personal history of other diseases of the digestive system: Secondary | ICD-10-CM | POA: Insufficient documentation

## 2015-04-22 DIAGNOSIS — E669 Obesity, unspecified: Secondary | ICD-10-CM | POA: Insufficient documentation

## 2015-04-22 DIAGNOSIS — Z880 Allergy status to penicillin: Secondary | ICD-10-CM | POA: Insufficient documentation

## 2015-04-22 DIAGNOSIS — R21 Rash and other nonspecific skin eruption: Secondary | ICD-10-CM

## 2015-04-22 MED ORDER — CLOTRIMAZOLE 1 % EX CREA: TOPICAL_CREAM | CUTANEOUS | Status: AC

## 2015-04-22 NOTE — ED Notes (Signed)
Pt reports rash to neck x 1 month. Urticaria noted.

## 2015-04-22 NOTE — ED Notes (Signed)
Declined W/C at D/C and was escorted to lobby by RN. 

## 2015-04-22 NOTE — Discharge Instructions (Signed)
Return to the emergency room with worsening of symptoms, new symptoms or with symptoms that are concerning, especially fevers, redness, nausea, vomiting, difficulty breathing, difficulty swallowing, throat pain. Use clotrimazole ointment 3 times daily to affected area. Keep the area nice and dry and to wash all your closed in hot water with non allergenic detergent. Stop using ivory soap. Follow-up with her primary care provider in one week. Allegra, Zyrtec, or Claritin for a change in. He may take Benadryl at night for itching. You may also take over-the-counter hydrocortisone cream Read below information and follow recommendations. Cutaneous Candidiasis Cutaneous candidiasis is a condition in which there is an overgrowth of yeast (candida) on the skin. Yeast normally live on the skin, but in small enough numbers not to cause any symptoms. In certain cases, increased growth of the yeast may cause an actual yeast infection. This kind of infection usually occurs in areas of the skin that are constantly warm and moist, such as the armpits or the groin. Yeast is the most common cause of diaper rash in babies and in people who cannot control their bowel movements (incontinence). CAUSES  The fungus that most often causes cutaneous candidiasis is Candida albicans. Conditions that can increase the risk of getting a yeast infection of the skin include:  Obesity.  Pregnancy.  Diabetes.  Taking antibiotic medicine.  Taking birth control pills.  Taking steroid medicines.  Thyroid disease.  An iron or zinc deficiency.  Problems with the immune system. SYMPTOMS   Red, swollen area of the skin.  Bumps on the skin.  Itchiness. DIAGNOSIS  The diagnosis of cutaneous candidiasis is usually based on its appearance. Light scrapings of the skin may also be taken and viewed under a microscope to identify the presence of yeast. TREATMENT  Antifungal creams may be applied to the infected skin. In  severe cases, oral medicines may be needed.  HOME CARE INSTRUCTIONS   Keep your skin clean and dry.  Maintain a healthy weight.  If you have diabetes, keep your blood sugar under control. SEEK IMMEDIATE MEDICAL CARE IF:  Your rash continues to spread despite treatment.  You have a fever, chills, or abdominal pain. Document Released: 07/19/2011 Document Revised: 01/23/2012 Document Reviewed: 07/19/2011 [facility] Patient Information 2015 [place], [place]. This information is not intended to replace advice given to you by your health care provider. Make sure you discuss any questions you have with your health care provider.

## 2015-04-22 NOTE — ED Provider Notes (Signed)
CSN: [id number]     Arrival date & time 04/22/15  1155 History  This chart was scribed for non-physician practitioner, Oswaldo ConroyVictoria Jareli Highland, PA-C working with Samuel JesterKathleen McManus, DO by Placido SouLogan Joldersma, ED scribe. This patient was seen in room TR09C/TR09C and the patient's care was started at 2:08 PM.    Chief Complaint  Patient presents with  . Rash    The history is provided by the patient. No language interpreter was used.    HPI Comments: [name] is a 34 y.o. female who presents to the Emergency Department complaining of a worsening, itchy, rash on her neck with onset 1 week ago. She additionally notes using a new soap as well as new work shirts at the onset of the rash. Pt notes worsening of symptoms when scratching. She notes trying coco butter with slight alleviation of symptoms with them returning shortly thereafter. Pt denies any time spent outdoors or recent tick bites. Pt denies nausea, vomiting, fever, chills, SOB, or new medications.   Past Medical History  Diagnosis Date  . Obesity   . Obesity   . GERD (gastroesophageal reflux disease)   . Gallstone (impacted) 05/09/2012  . Normal pregnancy, repeat 05/09/2012  . S/P cesarean section 05/10/2012   Past Surgical History  Procedure Laterality Date  . Cesarean section  [id number]  . Cesarean section  05/10/2012  . Cesarean section  [id number]  . Cesarean section  [id number]  . Cholecystectomy  07/04/2012    Procedure: LAPAROSCOPIC CHOLECYSTECTOMY;  Surgeon: Atilano InaEric M Wilson, MD,FACS;  Location: [facility];  Service: General;  Laterality: N/A;   Family History  Problem Relation Age of Onset  . Anesthesia problems Neg Hx   . Hypotension Neg Hx   . Malignant hyperthermia Neg Hx   . Pseudochol deficiency Neg Hx   . Asthma Mother   . COPD Mother   . Arthritis Mother   . Diabetes Mother   . Hypertension Mother   . Stroke Maternal Aunt   . Diabetes Maternal Aunt   . Birth defects Maternal Aunt     breast  . Stroke Maternal Grandmother    . Diabetes Maternal Grandmother   . Stroke Paternal Grandmother   . Birth defects Paternal Uncle     colon   History  Substance Use Topics  . Smoking status: Never Smoker   . Smokeless tobacco: Never Used  . Alcohol Use: No   OB History    Gravida Para Term Preterm AB TAB SAB Ectopic Multiple Living   4 4 3 1  0 0 0 0 0 4     Review of Systems  Constitutional: Negative for fever and chills.  Respiratory: Negative for cough and shortness of breath.   Gastrointestinal: Negative for nausea and vomiting.      Allergies  Amoxicillin and Penicillins  Home Medications   Prior to Admission medications   Medication Sig Start Date End Date Taking? Authorizing Provider  acetaminophen (TYLENOL) 500 MG tablet Take 1,000 mg by mouth every 6 (six) hours as needed for headache.    Historical Provider, MD  clotrimazole (LOTRIMIN) 1 % cream Apply to affected area 2 times daily 04/22/15   [place]Victoria Brandalynn Ofallon, PA-C  ibuprofen (ADVIL,MOTRIN) 200 MG tablet Take 400 mg by mouth every 6 (six) hours as needed for headache.    Historical Provider, MD  Multiple Vitamins-Minerals (MULTIVITAMIN & MINERAL PO) Take 1 tablet by mouth daily.    Historical Provider, MD  ondansetron (ZOFRAN ODT) 8 MG disintegrating tablet Take 1  tablet (8 mg total) by mouth every 8 (eight) hours as needed for nausea or vomiting. 04/15/15   Azalia Bilis, MD   BP 118/79 mmHg  Pulse 66  Temp(Src) 98.1 F (36.7 C) (Oral)  Resp 16  SpO2 100%  LMP 04/21/2015 Physical Exam  Constitutional: She appears well-developed and well-nourished. No distress.  HENT:  Head: Normocephalic and atraumatic.  No oral pharynx lesions, vesicles or swelling  Eyes: Conjunctivae and EOM are normal. Right eye exhibits no discharge. Left eye exhibits no discharge.  Cardiovascular: Normal rate and regular rhythm.   Pulmonary/Chest: Effort normal and breath sounds normal. No respiratory distress. She has no wheezes.  Abdominal: Soft. Bowel sounds are  normal. She exhibits no distension. There is no tenderness.  Neurological: She is alert. She exhibits normal muscle tone. Coordination normal.  Skin: Skin is warm and dry. She is not diaphoretic.  Raised erythematous rash to bilateral neck in distribution of shirt collar. No fluctuance, discharge. No tick bites.  Nursing note and vitals reviewed.   ED Course  Procedures  DIAGNOSTIC STUDIES: Oxygen Saturation is 98% on RA, normal by my interpretation.    COORDINATION OF CARE: 2:13 PM Discussed treatment plan with pt at bedside and pt agreed to plan.  Labs Review Labs Reviewed - No data to display  Imaging Review No results found.   EKG Interpretation None      Meds given in ED:  Medications - No data to display  Discharge Medication List as of 04/22/2015  2:25 PM    START taking these medications   Details  clotrimazole (LOTRIMIN) 1 % cream Apply to affected area 2 times daily, Print          MDM   Final diagnoses:  Rash of neck   Pt with rash resembling cutaneous candidiasis in bilateral neck in distribution of shirt collar with report of new work clothes and long hours. VSS. Treat with clotrimazole, antihistamines, hydrocortisone. Follow up with PCP in one week without resolution.  Discussed return precautions with patient. Patient verbalizes understanding and agrees with plan.  I personally performed the services described in this documentation, which was scribed in my presence. The recorded information has been reviewed and is accurate.   Oswaldo Conroy, PA-C 04/22/15 2038  Samuel Jester, DO 04/23/15 [phone]

## 2015-04-23 ENCOUNTER — Encounter (HOSPITAL_COMMUNITY): Payer: Self-pay | Admitting: Obstetrics and Gynecology

## 2015-12-30 ENCOUNTER — Emergency Department (HOSPITAL_COMMUNITY)
Admission: EM | Admit: 2015-12-30 | Discharge: 2015-12-30 | Disposition: A | Discharge status: Home / Self Care | Payer: Medicaid Other | Attending: Emergency Medicine | Admitting: Emergency Medicine

## 2015-12-30 ENCOUNTER — Encounter (HOSPITAL_COMMUNITY): Payer: Self-pay | Admitting: Family Medicine

## 2015-12-30 DIAGNOSIS — Z79899 Other long term (current) drug therapy: Secondary | ICD-10-CM | POA: Insufficient documentation

## 2015-12-30 DIAGNOSIS — R300 Dysuria: Secondary | ICD-10-CM | POA: Insufficient documentation

## 2015-12-30 DIAGNOSIS — R350 Frequency of micturition: Secondary | ICD-10-CM | POA: Insufficient documentation

## 2015-12-30 DIAGNOSIS — E669 Obesity, unspecified: Secondary | ICD-10-CM | POA: Insufficient documentation

## 2015-12-30 DIAGNOSIS — D649 Anemia, unspecified: Secondary | ICD-10-CM | POA: Diagnosis not present

## 2015-12-30 DIAGNOSIS — R1031 Right lower quadrant pain: Secondary | ICD-10-CM | POA: Insufficient documentation

## 2015-12-30 DIAGNOSIS — Z202 Contact with and (suspected) exposure to infections with a predominantly sexual mode of transmission: Secondary | ICD-10-CM | POA: Insufficient documentation

## 2015-12-30 DIAGNOSIS — Z880 Allergy status to penicillin: Secondary | ICD-10-CM | POA: Diagnosis not present

## 2015-12-30 DIAGNOSIS — R319 Hematuria, unspecified: Secondary | ICD-10-CM | POA: Insufficient documentation

## 2015-12-30 DIAGNOSIS — Z711 Person with feared health complaint in whom no diagnosis is made: Secondary | ICD-10-CM

## 2015-12-30 DIAGNOSIS — N898 Other specified noninflammatory disorders of vagina: Secondary | ICD-10-CM | POA: Diagnosis present

## 2015-12-30 DIAGNOSIS — Z8719 Personal history of other diseases of the digestive system: Secondary | ICD-10-CM | POA: Insufficient documentation

## 2015-12-30 LAB — CBC
HCT: 35.6 % — ABNORMAL LOW (ref 36.0–46.0)
Hemoglobin: 11.6 g/dL — ABNORMAL LOW (ref 12.0–15.0)
MCH: 30 pg (ref 26.0–34.0)
MCHC: 32.6 g/dL (ref 30.0–36.0)
MCV: 92 fL (ref 78.0–100.0)
PLATELETS: 184 10*3/uL (ref 150–400)
RBC: 3.87 MIL/uL (ref 3.87–5.11)
RDW: 13.1 % (ref 11.5–15.5)
WBC: 7.8 10*3/uL (ref 4.0–10.5)

## 2015-12-30 LAB — URINE MICROSCOPIC-ADD ON

## 2015-12-30 LAB — COMPREHENSIVE METABOLIC PANEL
ALT: 14 U/L (ref 14–54)
ANION GAP: 8 (ref 5–15)
AST: 18 U/L (ref 15–41)
Albumin: 3.4 g/dL — ABNORMAL LOW (ref 3.5–5.0)
Alkaline Phosphatase: 54 U/L (ref 38–126)
BUN: 9 mg/dL (ref 6–20)
CO2: 25 mmol/L (ref 22–32)
CREATININE: 0.91 mg/dL (ref 0.44–1.00)
Calcium: 8.9 mg/dL (ref 8.9–10.3)
Chloride: 109 mmol/L (ref 101–111)
GFR calc Af Amer: >60 mL/min (ref >60)
GFR calc non Af Amer: >60 mL/min (ref >60)
Glucose, Bld: 107 mg/dL — ABNORMAL HIGH (ref 65–99)
POTASSIUM: 4 mmol/L (ref 3.5–5.1)
SODIUM: 142 mmol/L (ref 135–145)
Total Bilirubin: 0.5 mg/dL (ref 0.3–1.2)
Total Protein: 7 g/dL (ref 6.5–8.1)

## 2015-12-30 LAB — URINALYSIS, ROUTINE W REFLEX MICROSCOPIC
Bilirubin Urine: NEGATIVE
GLUCOSE, UA: NEGATIVE mg/dL
KETONES UR: NEGATIVE mg/dL
LEUKOCYTES UA: NEGATIVE
Nitrite: NEGATIVE
PROTEIN: NEGATIVE mg/dL
Specific Gravity, Urine: 1.026 (ref 1.005–1.030)
pH: 5 (ref 5.0–8.0)

## 2015-12-30 LAB — I-STAT BETA HCG BLOOD, ED (MC, WL, AP ONLY)

## 2015-12-30 LAB — LIPASE, BLOOD: Lipase: 25 U/L (ref 11–51)

## 2015-12-30 MED ORDER — CEFTRIAXONE SODIUM 250 MG IJ SOLR
250.0000 mg | Freq: Once | INTRAMUSCULAR | Status: AC
  Administered 2015-12-30: 250 mg via INTRAMUSCULAR
  Filled 2015-12-30: qty 250

## 2015-12-30 MED ORDER — AZITHROMYCIN 250 MG PO TABS
1000.0000 mg | ORAL_TABLET | Freq: Once | ORAL | Status: AC
  Administered 2015-12-30: 1000 mg via ORAL
  Filled 2015-12-30: qty 4

## 2015-12-30 MED ORDER — LIDOCAINE HCL (PF) 1 % IJ SOLN
5.0000 mL | Freq: Once | INTRAMUSCULAR | Status: AC
  Administered 2015-12-30: 5 mL
  Filled 2015-12-30: qty 5

## 2015-12-30 NOTE — ED Provider Notes (Signed)
CSN: [id number]     Arrival date & time 12/30/15  1419 History  By signing my name below, I, [name], attest that this documentation has been prepared under the direction and in the presence of non-physician practitioner, Haynes Dage, PA-C. Electronically Signed: Linna [name], Scribe. 12/30/2015. 4:29 PM.    Chief Complaint  Patient presents with  . Abdominal Pain  . Vaginal Discharge    The history is provided by the patient. No language interpreter was used.     HPI Comments: [name] is a 35 y.o. female with h/o GERD who presents to the Emergency Department complaining of sudden onset, constant, lower right abdominal pain beginning yesterday. She reports associated urinary frequency, dysuria, and hematuria as well. She endorses pain with palpation to the lower right abdomen. Pt reports that she had intercourse with her ex-husband who may have chlamydia; she currently only has one partner. Her LNMP was approximately two weeks ago. She is not on birth control; she had a tubal ligation. Pt is allergic to amoxicillin and penicillins. She denies any abnormal vaginal discharge or any other associated symptoms at this time. She has a PSHx of cholecystectomy.    Past Medical History  Diagnosis Date  . Obesity   . Obesity   . GERD (gastroesophageal reflux disease)   . Gallstone (impacted) 05/09/2012  . Normal pregnancy, repeat 05/09/2012  . S/P cesarean section 05/10/2012   Past Surgical History  Procedure Laterality Date  . Cesarean section  [id number]  . Cesarean section  05/10/2012  . Cesarean section  [id number]  . Cesarean section  [id number]  . Cholecystectomy  07/04/2012    Procedure: LAPAROSCOPIC CHOLECYSTECTOMY;  Surgeon: Atilano Ina, MD,FACS;  Location: [facility];  Service: General;  Laterality: N/A;   Family History  Problem Relation Age of Onset  . Anesthesia problems Neg Hx   . Hypotension Neg Hx   . Malignant hyperthermia Neg Hx   . Pseudochol deficiency Neg  Hx   . Asthma Mother   . COPD Mother   . Arthritis Mother   . Diabetes Mother   . Hypertension Mother   . Stroke Maternal Aunt   . Diabetes Maternal Aunt   . Birth defects Maternal Aunt     breast  . Stroke Maternal Grandmother   . Diabetes Maternal Grandmother   . Stroke Paternal Grandmother   . Birth defects Paternal Uncle     colon   Social History  Substance Use Topics  . Smoking status: Never Smoker   . Smokeless tobacco: Never Used  . Alcohol Use: No   OB History    Gravida Para Term Preterm AB TAB SAB Ectopic Multiple Living   0 0 0 0 0 4     Review of Systems  Gastrointestinal: Positive for abdominal pain (lower right).  Genitourinary: Positive for dysuria and frequency. Negative for vaginal discharge.  All other systems reviewed and are negative.     Allergies  Amoxicillin and Penicillins  Home Medications   Prior to Admission medications   Medication Sig Start Date End Date Taking? Authorizing Provider  acetaminophen (TYLENOL) 500 MG tablet Take 1,000 mg by mouth every 6 (six) hours as needed for headache.    Historical Provider, MD  clotrimazole (LOTRIMIN) 1 % cream Apply to affected area 2 times daily 04/22/15   [place], PA-C  ibuprofen (ADVIL,MOTRIN) 200 MG tablet Take 400 mg by mouth every 6 (six) hours as needed for headache.  Historical Provider, MD  Multiple Vitamins-Minerals (MULTIVITAMIN & MINERAL PO) Take 1 tablet by mouth daily.    Historical Provider, MD  ondansetron (ZOFRAN ODT) 8 MG disintegrating tablet Take 1 tablet (8 mg total) by mouth every 8 (eight) hours as needed for nausea or vomiting. 04/15/15   Azalia Bilis, MD   BP 137/76 mmHg  Pulse 70  Temp(Src) 98.6 F (37 C)  Resp 16  SpO2 100%  LMP 12/12/2015 Physical Exam  Constitutional: She is oriented to person, place, and time. She appears well-developed and well-nourished. No distress.  Obese. No acute distress.  HENT:  Head: Normocephalic and atraumatic.  Eyes:  Conjunctivae and EOM are normal.  Neck: Neck supple. No tracheal deviation present.  Cardiovascular: Normal rate, regular rhythm and normal heart sounds.   Pulmonary/Chest: Effort normal. No respiratory distress.  Abdominal: Soft. She exhibits no distension. There is tenderness in the right lower quadrant. There is no rebound, no guarding and no CVA tenderness.  Tenderness in the right lower quadrant. No guarding or rebound. Obese abdomen. No distention. No CVA tenderness. Ambulatory without pain. Walking around the ED on the phone in no acute distress.  Musculoskeletal: Normal range of motion.  Neurological: She is alert and oriented to person, place, and time.  Skin: Skin is warm and dry.  Psychiatric: She has a normal mood and affect. Her behavior is normal.  Nursing note and vitals reviewed.   ED Course  Procedures (including critical care time)  DIAGNOSTIC STUDIES: Oxygen Saturation is 100% on RA, normal by my interpretation.    COORDINATION OF CARE: 4:29 PM Will order urine culture. Discussed treatment plan with pt at bedside and pt agreed to plan.   Labs Review Labs Reviewed  COMPREHENSIVE METABOLIC PANEL - Abnormal; Notable for the following:    Glucose, Bld 107 (*)    Albumin 3.4 (*)    All other components within normal limits  CBC - Abnormal; Notable for the following:    Hemoglobin 11.6 (*)    HCT 35.6 (*)    All other components within normal limits  URINALYSIS, ROUTINE W REFLEX MICROSCOPIC (NOT AT [facility]) - Abnormal; Notable for the following:    APPearance HAZY (*)    Hgb urine dipstick MODERATE (*)    All other components within normal limits  URINE MICROSCOPIC-ADD ON - Abnormal; Notable for the following:    Squamous Epithelial / LPF 6-30 (*)    Bacteria, UA FEW (*)    All other components within normal limits  URINE CULTURE  LIPASE, BLOOD  I-STAT BETA HCG BLOOD, ED ([facility], [facility], [facility] ONLY)    Imaging Review No results found. I have personally reviewed and  evaluated these lab results as part of my medical decision-making.   EKG Interpretation None      MDM   Final diagnoses:  Concern about STD in female without diagnosis  Patient presents for STD check as well as urinary symptoms and right lower quadrant pain. On exam, the patient has right lower quadrant abdominal tenderness. No guarding or rebound. Pt refused further evaluation for appendicitis, ovarian torsion, or ovarian cyst with pelvic exam or other imaging. Patient stated she would follow up at health department tomorrow because she has 4 small children in the room. I discussed the seriousness of her abdominal pain and return precautions at length. Her labs were not concerning. She is mildly anemic. She also had hemoglobin in her urine but no signs of UTI. No history of kidney stones. No concerns  for ectopic pregnancy as patient is not pregnant.  Patient stated she would go to the health Department tomorrow. I will treat for STD prophylaxis. I discussed amoxicillin and penicillin reaction with pharmacy who stated that there was a very small chance of reaction with ceftriaxone IM. Patient was observed in the ED and did not have a reaction to the medication. Filed Vitals:   12/30/15 1455 12/30/15 1812  BP: 130/80 137/76  Pulse: 63 70  Temp: 98.6 F (37 C)   Resp: 18 16   Medications  azithromycin (ZITHROMAX) tablet 1,000 mg (1,000 mg Oral Given 12/30/15 1753)  cefTRIAXone (ROCEPHIN) injection 250 mg (250 mg Intramuscular Given 12/30/15 1753)  lidocaine (PF) (XYLOCAINE) 1 % injection 5 mL (5 mLs Other Given 12/30/15 1753)   I personally performed the services described in this documentation, which was scribed in my presence. The recorded information has been reviewed and is accurate.      Catha Gosselin, PA-C 12/30/15 1949  Donnetta Hutching, MD 12/30/15 2308

## 2015-12-30 NOTE — ED Notes (Signed)
Pt here for lower abd pain with urinary frequency, burning. Denies vaginal discharge.

## 2015-12-30 NOTE — ED Notes (Signed)
The pt had no adverse reactions to the im injection

## 2015-12-31 LAB — URINE CULTURE: Special Requests: NORMAL

## 2016-09-26 ENCOUNTER — Encounter (HOSPITAL_COMMUNITY): Payer: Self-pay | Admitting: Nurse Practitioner

## 2016-09-26 ENCOUNTER — Emergency Department (HOSPITAL_COMMUNITY): Payer: Medicaid Other

## 2016-09-26 ENCOUNTER — Emergency Department (HOSPITAL_COMMUNITY)
Admission: EM | Admit: 2016-09-26 | Discharge: 2016-09-26 | Disposition: A | Discharge status: Home / Self Care | Payer: Medicaid Other | Attending: Emergency Medicine | Admitting: Emergency Medicine

## 2016-09-26 DIAGNOSIS — Y999 Unspecified external cause status: Secondary | ICD-10-CM | POA: Diagnosis not present

## 2016-09-26 DIAGNOSIS — S0993XA Unspecified injury of face, initial encounter: Secondary | ICD-10-CM | POA: Diagnosis present

## 2016-09-26 DIAGNOSIS — R51 Headache: Secondary | ICD-10-CM | POA: Diagnosis not present

## 2016-09-26 DIAGNOSIS — Y9259 Other trade areas as the place of occurrence of the external cause: Secondary | ICD-10-CM | POA: Diagnosis not present

## 2016-09-26 DIAGNOSIS — S0083XA Contusion of other part of head, initial encounter: Secondary | ICD-10-CM

## 2016-09-26 DIAGNOSIS — S0012XA Contusion of left eyelid and periocular area, initial encounter: Secondary | ICD-10-CM | POA: Insufficient documentation

## 2016-09-26 DIAGNOSIS — Y9389 Activity, other specified: Secondary | ICD-10-CM | POA: Diagnosis not present

## 2016-09-26 DIAGNOSIS — W208XXA Other cause of strike by thrown, projected or falling object, initial encounter: Secondary | ICD-10-CM | POA: Insufficient documentation

## 2016-09-26 NOTE — ED Triage Notes (Addendum)
Pt presents with c/o facial injury. She was cleaning garage 2 days ago when a can fell from a tall shelf onto her L eye. She has bruising around her L eye and redness to her sclera. She c/o headaches, light sensitivty. She denies LOC, dizziness, vision changes, n/v. She applied ice pack after the injury.

## 2016-09-26 NOTE — ED Notes (Signed)
Pt is in stable condition upon d/c and ambulates from ED. 

## 2016-09-26 NOTE — ED Provider Notes (Signed)
[facility] Provider Note   CSN: [id number] Arrival date & time: 09/26/16  1047     History   Chief Complaint Chief Complaint  Patient presents with  . Facial Injury    HPI [name] is a 35 y.o. female.  Pt said that she was cleaning her mom's garage on Thursday, 11/09 and a can fell on the left side of her face.  The pt said that she has bruising around her left eye and redness to her eye.  The pt denies any vision changes or n/v.  The pt denies loc.  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  The pt denies an assault.      Past Medical History:  Diagnosis Date  . Gallstone (impacted) 05/09/2012  . GERD (gastroesophageal reflux disease)   . Normal pregnancy, repeat 05/09/2012  . Obesity   . Obesity   . S/P cesarean section 05/10/2012    Patient Active Problem List   Diagnosis Date Noted  . Symptomatic cholelithiasis 06/20/2012  . S/P cesarean section 05/10/2012  . Gallstone (impacted) 05/09/2012  . Normal pregnancy, repeat 05/09/2012    Past Surgical History:  Procedure Laterality Date  . CESAREAN SECTION  [id number]  . CESAREAN SECTION  05/10/2012  . CESAREAN SECTION  [id number]  . CESAREAN SECTION  [id number]  . CHOLECYSTECTOMY  07/04/2012   Procedure: LAPAROSCOPIC CHOLECYSTECTOMY;  Surgeon: Atilano InaEric M Wilson, MD,FACS;  Location: [facility];  Service: General;  Laterality: N/A;    OB History    Gravida Para Term Preterm AB Living   4 4 3 1  0 4   SAB TAB Ectopic Multiple Live Births   0 0 0 0 1       Home Medications    Prior to Admission medications   Medication Sig Start Date End Date Taking? Authorizing Provider  acetaminophen (TYLENOL) 500 MG tablet Take 1,000 mg by mouth every 6 (six) hours as needed for headache.    Historical Provider, MD  clotrimazole (LOTRIMIN) 1 % cream Apply to affected area 2 times daily 04/22/15   [place]ia Creech, PA-C  ibuprofen (ADVIL,MOTRIN) 200 MG tablet Take  400 mg by mouth every 6 (six) hours as needed for headache.    Historical Provider, MD  Multiple Vitamins-Minerals (MULTIVITAMIN & MINERAL PO) Take 1 tablet by mouth daily.    Historical Provider, MD  ondansetron (ZOFRAN ODT) 8 MG disintegrating tablet Take 1 tablet (8 mg total) by mouth every 8 (eight) hours as needed for nausea or vomiting. 04/15/15   Azalia BilisKevin Campos, MD    Family History Family History  Problem Relation Age of Onset  . Asthma Mother   . COPD Mother   . Arthritis Mother   . Diabetes Mother   . Hypertension Mother   . Stroke Maternal Aunt   . Diabetes Maternal Aunt   . Birth defects Maternal Aunt     breast  . Stroke Maternal Grandmother   . Diabetes Maternal Grandmother   . Stroke Paternal Grandmother   . Birth defects Paternal Uncle     colon  . Anesthesia problems Neg Hx   . Hypotension Neg Hx   . Malignant hyperthermia Neg Hx   . Pseudochol deficiency Neg Hx     Social History Social History  Substance Use Topics  . Smoking status: Never Smoker  . Smokeless tobacco: Never Used  . Alcohol use No  Allergies   Amoxicillin and Penicillins   Review of Systems Review of Systems  HENT: Positive for facial swelling.   All other systems reviewed and are negative.    Physical Exam Updated Vital Signs BP 140/72   Pulse 66   Temp 98.2 F (36.8 C) (Oral)   Resp 17   SpO2 99%   Physical Exam  Constitutional: She is oriented to person, place, and time. She appears well-developed and well-nourished.  HENT:  Head: Normocephalic.  Right Ear: External ear normal.  Left Ear: External ear normal.  Nose: Nose normal.  Mouth/Throat: Oropharynx is clear and moist.  Contusion around left eye.  Tenderness to lateral orbital rim.  Eyes: EOM are normal. Pupils are equal, round, and reactive to light. Left conjunctiva has a hemorrhage.  Neck: Normal range of motion. Neck supple.  Cardiovascular: Normal rate, regular rhythm, normal heart sounds and intact  distal pulses.   Pulmonary/Chest: Effort normal and breath sounds normal.  Abdominal: Soft. Bowel sounds are normal.  Musculoskeletal: Normal range of motion.  Neurological: She is alert and oriented to person, place, and time.  Skin: Skin is warm.  Psychiatric: She has a normal mood and affect. Her behavior is normal. Judgment and thought content normal.  Nursing note and vitals reviewed.    ED Treatments / Results  Labs (all labs ordered are listed, but only abnormal results are displayed) Labs Reviewed - No data to display  EKG  EKG Interpretation None       Radiology Ct Head Wo Contrast  Result Date: 09/26/2016 CLINICAL DATA:  Pt says she was hit in the eye by a can she has a black left eye side of her face temple area is sore EXAM: CT HEAD WITHOUT CONTRAST CT MAXILLOFACIAL WITHOUT CONTRAST TECHNIQUE: Multidetector CT imaging of the head and maxillofacial structures were performed using the standard protocol without intravenous contrast. Multiplanar CT image reconstructions of the maxillofacial structures were also generated. COMPARISON:  None. FINDINGS: CT HEAD FINDINGS Brain: No evidence of acute infarction, hemorrhage, hydrocephalus, extra-axial collection or mass lesion/mass effect. Vascular: No hyperdense vessel or unexpected calcification. Skull: Normal. Negative for fracture or focal lesion. Other: None. CT MAXILLOFACIAL FINDINGS Osseous: No fracture or mandibular dislocation. No destructive process. Orbits: Negative. No traumatic or inflammatory finding. Sinuses: Clear. Soft tissues: Negative. IMPRESSION: HEAD CT:  Normal.  No skull fracture. MAXILLOFACIAL CT:  Normal.  No fracture. Electronically Signed   By: Amie Portland M.D.   On: 09/26/2016 13:59   Ct Maxillofacial Wo Contrast  Result Date: 09/26/2016 CLINICAL DATA:  Pt says she was hit in the eye by a can she has a black left eye side of her face temple area is sore EXAM: CT HEAD WITHOUT CONTRAST CT MAXILLOFACIAL  WITHOUT CONTRAST TECHNIQUE: Multidetector CT imaging of the head and maxillofacial structures were performed using the standard protocol without intravenous contrast. Multiplanar CT image reconstructions of the maxillofacial structures were also generated. COMPARISON:  None. FINDINGS: CT HEAD FINDINGS Brain: No evidence of acute infarction, hemorrhage, hydrocephalus, extra-axial collection or mass lesion/mass effect. Vascular: No hyperdense vessel or unexpected calcification. Skull: Normal. Negative for fracture or focal lesion. Other: None. CT MAXILLOFACIAL FINDINGS Osseous: No fracture or mandibular dislocation. No destructive process. Orbits: Negative. No traumatic or inflammatory finding. Sinuses: Clear. Soft tissues: Negative. IMPRESSION: HEAD CT:  Normal.  No skull fracture. MAXILLOFACIAL CT:  Normal.  No fracture. Electronically Signed   By: Amie Portland M.D.   On: 09/26/2016 13:59  Procedures Procedures (including critical care time)  Medications Ordered in ED Medications - No data to display   Initial Impression / Assessment and Plan / ED Course  I have reviewed the triage vital signs and the nursing notes.  Pertinent labs & imaging results that were available during my care of the patient were reviewed by me and considered in my medical decision making (see chart for details).  Clinical Course     Ct scans ok.  Pt given a note for work.  She knows to return if worse.  Final Clinical Impressions(s) / ED Diagnoses   Final diagnoses:  Contusion of face, initial encounter    New Prescriptions New Prescriptions   No medications on file     Jacalyn LefevreJulie Bernardino Dowell, MD 09/26/16 1406

## 2016-11-11 ENCOUNTER — Emergency Department (HOSPITAL_COMMUNITY)
Admission: EM | Admit: 2016-11-11 | Discharge: 2016-11-11 | Disposition: A | Discharge status: Home / Self Care | Payer: Medicaid Other | Attending: Emergency Medicine | Admitting: Emergency Medicine

## 2016-11-11 ENCOUNTER — Encounter (HOSPITAL_COMMUNITY): Payer: Self-pay

## 2016-11-11 DIAGNOSIS — R05 Cough: Secondary | ICD-10-CM | POA: Diagnosis present

## 2016-11-11 DIAGNOSIS — J069 Acute upper respiratory infection, unspecified: Secondary | ICD-10-CM | POA: Diagnosis not present

## 2016-11-11 DIAGNOSIS — J45909 Unspecified asthma, uncomplicated: Secondary | ICD-10-CM | POA: Diagnosis not present

## 2016-11-11 MED ORDER — GUAIFENESIN 100 MG/5ML PO LIQD: 100.0000 mg | ORAL | 0 refills | Status: AC | PRN

## 2016-11-11 MED ORDER — ALBUTEROL SULFATE HFA 108 (90 BASE) MCG/ACT IN AERS
2.0000 | INHALATION_SPRAY | RESPIRATORY_TRACT | Status: DC | PRN
  Administered 2016-11-11: 2 via RESPIRATORY_TRACT
  Filled 2016-11-11: qty 6.7

## 2016-11-11 NOTE — ED Triage Notes (Signed)
Pt reports she has had sore throat, cough, and asthma exacerbation X2 weeks. No SOB noted, no wheezing noted on auscultation. Airway intact.

## 2016-11-11 NOTE — ED Notes (Signed)
See PA note for assessment  

## 2016-11-11 NOTE — ED Provider Notes (Signed)
[facility]-EMERGENCY DEPT Provider Note   CSN: [id number] Arrival date & time: 11/11/16  1123  By signing my name below, I, Alyssa GroveMartin Green, attest that this documentation has been prepared under the direction and in the presence of Roxy Horsemanobert Stellarose Cerny, PA-C. Electronically Signed: Alyssa GroveMartin Green, ED Scribe. 11/11/16. 12:03 PM.  History   Chief Complaint Chief Complaint  Patient presents with  . URI  . Asthma  . Sore Throat   The history is provided by the patient. No language interpreter was used.   HPI Comments: [name] is a 35 y.o. female who presents to the Emergency Department complaining of gradual onset, constant exacerbation of asthma for 2 days PTA. She has not had any problems with asthma since she was a child. Pt reports associated rhinorrhea, sore throat, cough, congestion, left ear pain and chest tightness. She takes Claritin at home. She reports sick contacts at work. Pt denies shortness of breath, wheezing, fever, generalized body aches  Past Medical History:  Diagnosis Date  . Gallstone (impacted) 05/09/2012  . GERD (gastroesophageal reflux disease)   . Normal pregnancy, repeat 05/09/2012  . Obesity   . Obesity   . S/P cesarean section 05/10/2012    Patient Active Problem List   Diagnosis Date Noted  . Symptomatic cholelithiasis 06/20/2012  . S/P cesarean section 05/10/2012  . Gallstone (impacted) 05/09/2012  . Normal pregnancy, repeat 05/09/2012    Past Surgical History:  Procedure Laterality Date  . CESAREAN SECTION  [id number]  . CESAREAN SECTION  05/10/2012  . CESAREAN SECTION  [id number]  . CESAREAN SECTION  [id number]  . CHOLECYSTECTOMY  07/04/2012   Procedure: LAPAROSCOPIC CHOLECYSTECTOMY;  Surgeon: Atilano InaEric M Wilson, MD,FACS;  Location: [facility];  Service: General;  Laterality: N/A;    OB History    Gravida Para Term Preterm AB Living   4 4 3 1  0 4   SAB TAB Ectopic Multiple Live Births   0 0 0 0 1       Home Medications    Prior to Admission medications     Medication Sig Start Date End Date Taking? Authorizing Provider  acetaminophen (TYLENOL) 500 MG tablet Take 1,000 mg by mouth every 6 (six) hours as needed for headache.    Historical Provider, MD  clotrimazole (LOTRIMIN) 1 % cream Apply to affected area 2 times daily 04/22/15   [place]azilandVictoria Creech, PA-C  ibuprofen (ADVIL,MOTRIN) 200 MG tablet Take 400 mg by mouth every 6 (six) hours as needed for headache.    Historical Provider, MD  Multiple Vitamins-Minerals (MULTIVITAMIN & MINERAL PO) Take 1 tablet by mouth daily.    Historical Provider, MD  ondansetron (ZOFRAN ODT) 8 MG disintegrating tablet Take 1 tablet (8 mg total) by mouth every 8 (eight) hours as needed for nausea or vomiting. 04/15/15   Azalia BilisKevin Campos, MD    Family History Family History  Problem Relation Age of Onset  . Asthma Mother   . COPD Mother   . Arthritis Mother   . Diabetes Mother   . Hypertension Mother   . Stroke Maternal Aunt   . Diabetes Maternal Aunt   . Birth defects Maternal Aunt     breast  . Stroke Maternal Grandmother   . Diabetes Maternal Grandmother   . Stroke Paternal Grandmother   . Birth defects Paternal Uncle     colon  . Anesthesia problems Neg Hx   . Hypotension Neg Hx   . Malignant hyperthermia Neg Hx   . Pseudochol deficiency Neg Hx  Social History Social History  Substance Use Topics  . Smoking status: Never Smoker  . Smokeless tobacco: Never Used  . Alcohol use No     Allergies   Amoxicillin and Penicillins   Review of Systems Review of Systems  Constitutional: Negative for fever.  HENT: Positive for congestion, ear pain, rhinorrhea and sore throat.   Respiratory: Positive for cough and chest tightness. Negative for shortness of breath and wheezing.   Musculoskeletal:       - Generalized body aches   Physical Exam Updated Vital Signs BP 128/70 (BP Location: Left Arm)   Pulse 65   Temp 97.3 F (36.3 C) (Oral)   Resp 18   Ht 5\' 3"  (1.6 m)   Wt 221 lb (100.2 kg)   LMP  11/07/2016 (Exact Date)   SpO2 99%   BMI 39.15 kg/m   Physical Exam Physical Exam  Constitutional: Pt  is oriented to person, place, and time. Appears well-developed and well-nourished. No distress.  HENT:  Head: Normocephalic and atraumatic.  Right Ear: Tympanic membrane, external ear and ear canal normal.  Left Ear: Tympanic membrane, external ear and ear canal normal.  Nose: Mucosal edema and moderate rhinorrhea present. No epistaxis. Right sinus exhibits no maxillary sinus tenderness and no frontal sinus tenderness. Left sinus exhibits no maxillary sinus tenderness and no frontal sinus tenderness.  Mouth/Throat: Uvula is midline and mucous membranes are normal. Mucous membranes are not pale and not cyanotic. No oropharyngeal exudate, posterior oropharyngeal edema, posterior oropharyngeal erythema or tonsillar abscesses.  Eyes: Conjunctivae are normal. Pupils are equal, round, and reactive to light.  Neck: Normal range of motion and full passive range of motion without pain.  Cardiovascular: Normal rate and intact distal pulses.   Pulmonary/Chest: Effort normal and breath sounds normal. No stridor.  Clear and equal breath sounds without focal wheezes, rhonchi, rales  Abdominal: Soft. Bowel sounds are normal. There is no tenderness.  Musculoskeletal: Normal range of motion.  Lymphadenopathy:    Pthas no cervical adenopathy.  Neurological: Pt is alert and oriented to person, place, and time.  Skin: Skin is warm and dry. No rash noted. Pt is not diaphoretic.  Psychiatric: Normal mood and affect.  Nursing note and vitals reviewed.   ED Treatments / Results  DIAGNOSTIC STUDIES: Oxygen Saturation is 99% on RA, normal by my interpretation.    COORDINATION OF CARE: 11:59 AM Discussed treatment plan with pt at bedside which includes Albuterol inhaler and Guaifenesin and pt agreed to plan. Discussed return precautions with patient.   Labs (all labs ordered are listed, but only abnormal  results are displayed) Labs Reviewed - No data to display  EKG  EKG Interpretation None       Radiology No results found.  Procedures Procedures (including critical care time)  Medications Ordered in ED Medications - No data to display   Initial Impression / Assessment and Plan / ED Course  I have reviewed the triage vital signs and the nursing notes.  Pertinent labs & imaging results that were available during my care of the patient were reviewed by me and considered in my medical decision making (see chart for details).  Clinical Course    Patients symptoms are consistent with URI, likely viral etiology. Discussed that antibiotics are not indicated for viral infections. Pt will be discharged with symptomatic treatment.  Verbalizes understanding and is agreeable with plan. Pt is hemodynamically stable & in NAD prior to dc.   Final Clinical Impressions(s) / ED Diagnoses  Final diagnoses:  Upper respiratory tract infection, unspecified type    New Prescriptions New Prescriptions   No medications on file   I personally performed the services described in this documentation, which was scribed in my presence. The recorded information has been reviewed and is accurate.      Roxy Horsemanobert Philipp Callegari, PA-C 11/11/16 1255    Mancel BaleElliott Wentz, MD 11/11/16 [phone]
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
## 2017-04-02 ENCOUNTER — Emergency Department (HOSPITAL_COMMUNITY)
Admission: EM | Admit: 2017-04-02 | Discharge: 2017-04-02 | Disposition: A | Discharge status: Home / Self Care | Payer: Medicaid Other | Attending: Emergency Medicine | Admitting: Emergency Medicine

## 2017-04-02 ENCOUNTER — Encounter (HOSPITAL_COMMUNITY): Payer: Self-pay | Admitting: *Deleted

## 2017-04-02 DIAGNOSIS — S63502A Unspecified sprain of left wrist, initial encounter: Secondary | ICD-10-CM | POA: Diagnosis not present

## 2017-04-02 DIAGNOSIS — Y9389 Activity, other specified: Secondary | ICD-10-CM | POA: Insufficient documentation

## 2017-04-02 DIAGNOSIS — W208XXA Other cause of strike by thrown, projected or falling object, initial encounter: Secondary | ICD-10-CM | POA: Insufficient documentation

## 2017-04-02 DIAGNOSIS — Y929 Unspecified place or not applicable: Secondary | ICD-10-CM | POA: Diagnosis not present

## 2017-04-02 DIAGNOSIS — Y990 Civilian activity done for income or pay: Secondary | ICD-10-CM | POA: Diagnosis not present

## 2017-04-02 DIAGNOSIS — S6992XA Unspecified injury of left wrist, hand and finger(s), initial encounter: Secondary | ICD-10-CM | POA: Diagnosis present

## 2017-04-02 DIAGNOSIS — S63501A Unspecified sprain of right wrist, initial encounter: Secondary | ICD-10-CM

## 2017-04-02 MED ORDER — MELOXICAM 15 MG PO TABS: 15.0000 mg | ORAL_TABLET | Freq: Every day | ORAL | 0 refills | Status: AC

## 2017-04-02 NOTE — ED Provider Notes (Signed)
[facility]-EMERGENCY DEPT Provider Note    By signing my name below, I, Earmon Phoenix, attest that this documentation has been prepared under the direction and in the presence of Terance Hart, PA-C. Electronically Signed: Earmon Phoenix, ED Scribe. 04/02/17. 2:23 PM.    History   Chief Complaint Chief Complaint  Patient presents with  . Wrist Pain   The history is provided by the patient and medical records. No language interpreter was used.    [name] is an obese 36 y.o. female who presents to the Emergency Department complaining of left wrist pain that began 2-3 days ago. She reports associated swelling that has since resolved. She states she works at [employer] and a tea iron full of tea fell on the wrist. She has been able to work since the incident but reports pain. She has taken Tylenol for pain with minimal relief. Moving the wrist increases the pain. She denies alleviating factors. She denies bruising, wounds, numbness, tingling or weakness of the left hand or arm. She does have a PCP.   Past Medical History:  Diagnosis Date  . Gallstone (impacted) 05/09/2012  . GERD (gastroesophageal reflux disease)   . Normal pregnancy, repeat 05/09/2012  . Obesity   . Obesity   . S/P cesarean section 05/10/2012    Patient Active Problem List   Diagnosis Date Noted  . Symptomatic cholelithiasis 06/20/2012  . S/P cesarean section 05/10/2012  . Gallstone (impacted) 05/09/2012  . Normal pregnancy, repeat 05/09/2012    Past Surgical History:  Procedure Laterality Date  . CESAREAN SECTION  [id number]  . CESAREAN SECTION  05/10/2012  . CESAREAN SECTION  [id number]  . CESAREAN SECTION  [id number]  . CHOLECYSTECTOMY  07/04/2012   Procedure: LAPAROSCOPIC CHOLECYSTECTOMY;  Surgeon: Atilano Ina, MD,FACS;  Location: [facility];  Service: General;  Laterality: N/A;    OB History    Gravida Para Term Preterm AB Living   4 4 3 1  0 4   SAB TAB Ectopic Multiple Live Births   0 0 0 0 1        Home Medications    Prior to Admission medications   Medication Sig Start Date End Date Taking? Authorizing Provider  acetaminophen (TYLENOL) 500 MG tablet Take 1,000 mg by mouth every 6 (six) hours as needed for headache.    [provider]  clotrimazole (LOTRIMIN) 1 % cream Apply to affected area 2 times daily 04/22/15   Oswaldo Conroy, PA-C  guaiFENesin (ROBITUSSIN) 100 MG/5ML liquid Take 5-10 mLs (100-200 mg total) by mouth every 4 (four) hours as needed for cough. 11/11/16   Roxy Horseman, PA-C  ibuprofen (ADVIL,MOTRIN) 200 MG tablet Take 400 mg by mouth every 6 (six) hours as needed for headache.    [provider]  Multiple Vitamins-Minerals (MULTIVITAMIN & MINERAL PO) Take 1 tablet by mouth daily.    [provider]  ondansetron (ZOFRAN ODT) 8 MG disintegrating tablet Take 1 tablet (8 mg total) by mouth every 8 (eight) hours as needed for nausea or vomiting. 04/15/15   Azalia Bilis, MD    Family History Family History  Problem Relation Age of Onset  . Asthma Mother   . COPD Mother   . Arthritis Mother   . Diabetes Mother   . Hypertension Mother   . Stroke Maternal Aunt   . Diabetes Maternal Aunt   . Birth defects Maternal Aunt        breast  . Stroke Maternal Grandmother   . Diabetes Maternal  Grandmother   . Stroke Paternal Grandmother   . Birth defects Paternal Uncle        colon  . Anesthesia problems Neg Hx   . Hypotension Neg Hx   . Malignant hyperthermia Neg Hx   . Pseudochol deficiency Neg Hx     Social History Social History  Substance Use Topics  . Smoking status: Never Smoker  . Smokeless tobacco: Never Used  . Alcohol use No     Allergies   Amoxicillin and Penicillins   Review of Systems Review of Systems  Musculoskeletal: Positive for arthralgias. Negative for joint swelling.  Skin: Negative for color change and wound.  Neurological: Negative for weakness and numbness.     Physical Exam Updated Vital  Signs BP (!) 149/98 (BP Location: Right Arm)   Pulse 66   Temp 99 F (37.2 C) (Oral)   Resp 18   LMP 03/31/2017   SpO2 96%   Physical Exam  Constitutional: She is oriented to person, place, and time. She appears well-developed and well-nourished.  HENT:  Head: Normocephalic and atraumatic.  Neck: Normal range of motion.  Cardiovascular: Normal rate.   Pulmonary/Chest: Effort normal.  Musculoskeletal:  No obvious swelling or deformity. Diffuse tenderness of posterior and anterior aspect of left wrist. Decreased ROM due to pain. Full ROM of all fingers of left hand.  Neurological: She is alert and oriented to person, place, and time.  NVI  Skin: Skin is warm and dry.  Psychiatric: She has a normal mood and affect. Her behavior is normal.  Nursing note and vitals reviewed.    ED Treatments / Results  DIAGNOSTIC STUDIES: Oxygen Saturation is 96% on RA, adequate by my interpretation.   COORDINATION OF CARE: 2:19 PM- Will treat symptomatically and provide brace. Pt verbalizes understanding and agrees to plan.  Medications - No data to display  Labs (all labs ordered are listed, but only abnormal results are displayed) Labs Reviewed - No data to display  EKG  EKG Interpretation None       Radiology No results found.  Procedures Procedures (including critical care time)  Medications Ordered in ED Medications - No data to display   Initial Impression / Assessment and Plan / ED Course  I have reviewed the triage vital signs and the nursing notes.  Pertinent labs & imaging results that were available during my care of the patient were reviewed by me and considered in my medical decision making (see chart for details).  Patient presents for left wrist sprain after a tea iron full of tea fell on the area 2-3 days ago. She has full ROM of wrist - do not think imaging is indicated. Pt advised to follow up with orthopedics. Patient given brace while in ED, conservative  therapy recommended and discussed. Patient will be discharged home & is agreeable with above plan. Returns precautions discussed. Pt appears safe for discharge.  Final Clinical Impressions(s) / ED Diagnoses   Final diagnoses:  Sprain of right wrist, initial encounter    New Prescriptions New Prescriptions   No medications on file    I personally performed the services described in this documentation, which was scribed in my presence. The recorded information has been reviewed and is accurate.     Bethel BornGekas, Taya Ashbaugh Marie, PA-C 04/04/17 0656    Clarene DukeLittle, Ambrose Finlandachel Morgan, MD 04/04/17 [phone]

## 2017-04-02 NOTE — Discharge Instructions (Signed)
Wear brace for comfort and support Take Meloxicam (pain medicine) once a day Continue Tylenol for pain as well Follow up with your doctor

## 2017-04-02 NOTE — ED Triage Notes (Signed)
Pt complains of left wrist pain since dropping a tea iron (the large metal cannister that holds tea at [employer]) on her let wrist at work. Pt has limited ROM in her left wrist. Pt has been taking tylenol w/o relief.
